# Patient Record
Sex: Female | Born: 1945 | Race: White | Hispanic: No | Marital: Married | Smoking: Never smoker
Health system: Southern US, Community
[De-identification: ages and names within clinical notes are randomized; demographics above are authoritative.]

## PROBLEM LIST (undated history)

## (undated) DIAGNOSIS — E7800 Pure hypercholesterolemia, unspecified: Secondary | ICD-10-CM

## (undated) DIAGNOSIS — D649 Anemia, unspecified: Secondary | ICD-10-CM

## (undated) DIAGNOSIS — M169 Osteoarthritis of hip, unspecified: Secondary | ICD-10-CM

## (undated) DIAGNOSIS — I10 Essential (primary) hypertension: Secondary | ICD-10-CM

## (undated) DIAGNOSIS — C801 Malignant (primary) neoplasm, unspecified: Secondary | ICD-10-CM

## (undated) DIAGNOSIS — R7303 Prediabetes: Secondary | ICD-10-CM

## (undated) HISTORY — PX: CLOSED MANIPULATION SHOULDER: SUR205

## (undated) HISTORY — PX: CHOLECYSTECTOMY: SHX55

## (undated) HISTORY — PX: WISDOM TOOTH EXTRACTION: SHX21

## (undated) HISTORY — PX: TUBAL LIGATION: SHX77

## (undated) HISTORY — PX: BLEPHAROPLASTY: SUR158

---

## 2008-07-02 HISTORY — PX: ABDOMINAL HYSTERECTOMY: SHX81

## 2017-08-21 ENCOUNTER — Ambulatory Visit (INDEPENDENT_AMBULATORY_CARE_PROVIDER_SITE_OTHER): Payer: Medicare HMO

## 2017-08-21 ENCOUNTER — Other Ambulatory Visit (INDEPENDENT_AMBULATORY_CARE_PROVIDER_SITE_OTHER): Payer: Self-pay

## 2017-08-21 ENCOUNTER — Encounter (INDEPENDENT_AMBULATORY_CARE_PROVIDER_SITE_OTHER): Payer: Self-pay | Admitting: Orthopaedic Surgery

## 2017-08-21 ENCOUNTER — Ambulatory Visit (INDEPENDENT_AMBULATORY_CARE_PROVIDER_SITE_OTHER): Payer: Medicare HMO | Admitting: Orthopaedic Surgery

## 2017-08-21 DIAGNOSIS — M25551 Pain in right hip: Secondary | ICD-10-CM

## 2017-08-21 DIAGNOSIS — M1712 Unilateral primary osteoarthritis, left knee: Secondary | ICD-10-CM | POA: Diagnosis not present

## 2017-08-21 DIAGNOSIS — M1611 Unilateral primary osteoarthritis, right hip: Secondary | ICD-10-CM

## 2017-08-21 NOTE — Progress Notes (Signed)
Office Visit Note   Patient: Kelly Ayers           Date of Birth: [birth year]           MRN: [id number] Visit Date: 08/21/2017              Requested by: No referring provider defined for this encounter. PCP: Loraine Leriche., MD   Assessment & Plan: Visit Diagnoses:  1. Primary osteoarthritis of right hip   2. Primary osteoarthritis of left knee     Plan: We will send her for an intra-articular injection of the right hip with Dr. Ernestina Patches.  See him back in approximately 2 months at that time see what type of results she is had with the hip injection and possibly set her up for a right total hip arthroplasty sometime this summer when she is finished with [employer] and [place].  Regards to the knee will see how well she does with the cortisone injection.  Discussed with her at length today both hip and knee replacement.  Due to the fact that she is having severe pain that the quality of life particularly with the right hip she most likely will require a right total hip arthroplasty in the near future.  We are trying conservative measures at this time to get her through [employer] and she is employed in [employer].  Dr. Ninfa Linden and myself discussed with her at length the risks and benefits of hip replacement including infection, DVT/PE, wound healing problems, prolonged pain worsening pain, and nerve /  vessel injury.  Follow-Up Instructions: Return in about 2 months (around 10/19/2017).   Orders:  Orders Placed This Encounter  Procedures  . XR HIP UNILAT W OR W/O PELVIS 1V RIGHT  . XR Knee 1-2 Views Left   No orders of the defined types were placed in this encounter.     Procedures: No procedures performed   Clinical Data: No additional findings.   Subjective: Chief Complaint  Patient presents with  . Right Hip - Pain    HPI Kelly Ayers is a 72 year old female who comes in today with right hip pain for the past 2 years also some low  back pain.  No radicular symptoms down the legs.  She has left knee pain also this been ongoing for some time.  She states she is been told in the past that she needs replacement of both of the right hip and left knee.  Right hip pain is worse than left knee pain.  She has the right hip groin pain.  She has problems donning her shoes and socks trimming her toenails.  Left knee gives way occasionally.  No other mechanical symptoms.  She notes no swelling.  She has trouble going up and down steps due to the hip and knee.  She states overall that her gait is not as good as it used to be and it keeps her from doing activities she otherwise like to dissipate and.  She is tried no braces no assistive devices.  He has had no injections.  Occasional Aleve.  No history of DVT PE Review of Systems No fevers chills shortness of breath chest pain please otherwise see HPI.  Objective: Vital Signs: There were no vitals taken for this visit.  Physical Exam  Constitutional: She is oriented to person, place, and time. She appears well-developed and well-nourished. No distress.  Cardiovascular: Intact distal pulses.  Pulmonary/Chest: Effort normal.  Neurological: She is alert  and oriented to person, place, and time.  Skin: She is not diaphoretic.  Psychiatric: She has a normal mood and affect. Her behavior is normal.    Ortho Exam Bilateral hips left hip good range of motion without pain.  She has discomfort and decreased internal rotation of the right hip.  Calves are supple nontender bilaterally.  Right knee full range of motion without pain.  No tenderness along medial lateral joint line.  No instability valgus varus stressing.  No effusion abnormal warmth erythema of either knee.  Left knee tenderness along the medial joint line.  No instability valgus varus stressing. Specialty Comments:  No specialty comments available.  Imaging: Xr Hip Unilat W Or W/o Pelvis 1v Right  Result Date: 08/21/2017 AP pelvis  and lateral view of the right hip: End-stage arthritis right hip.  Left hip is well maintained.  Both hips well located.  No acute fracture is noted.  Degenerative changes lower lumbar spine noted.  Xr Knee 1-2 Views Left  Result Date: 08/21/2017 Left knee AP lateral views: Near bone-on-bone medial compartment.  Severe patellofemoral arthritic changes.  Lateral compartment overall well-maintained.  No acute fractures.  Knee is well located.  Right knee on the AP view shows blunting of the femoral condyles with moderate compartmental narrowing medial and lateral joint line.    PMFS History: There are no active problems to display for this patient.  History reviewed. No pertinent past medical history.  History reviewed. No pertinent family history.  History reviewed. No pertinent surgical history. Social History   Occupational History  . Not on file  Tobacco Use  . Smoking status: Not on file  Substance and Sexual Activity  . Alcohol use: Not on file  . Drug use: Not on file  . Sexual activity: Not on file

## 2017-08-30 ENCOUNTER — Ambulatory Visit (INDEPENDENT_AMBULATORY_CARE_PROVIDER_SITE_OTHER): Payer: Medicare HMO

## 2017-08-30 ENCOUNTER — Ambulatory Visit (INDEPENDENT_AMBULATORY_CARE_PROVIDER_SITE_OTHER): Payer: Medicare HMO | Admitting: Physical Medicine and Rehabilitation

## 2017-08-30 ENCOUNTER — Encounter (INDEPENDENT_AMBULATORY_CARE_PROVIDER_SITE_OTHER): Payer: Self-pay | Admitting: Physical Medicine and Rehabilitation

## 2017-08-30 DIAGNOSIS — M25551 Pain in right hip: Secondary | ICD-10-CM | POA: Diagnosis not present

## 2017-08-30 MED ORDER — BUPIVACAINE HCL 0.5 % IJ SOLN
3.0000 mL | INTRAMUSCULAR | Status: AC | PRN
  Administered 2017-08-30: 3 mL via INTRA_ARTICULAR

## 2017-08-30 MED ORDER — TRIAMCINOLONE ACETONIDE 40 MG/ML IJ SUSP
80.0000 mg | INTRAMUSCULAR | Status: AC | PRN
  Administered 2017-08-30: 80 mg via INTRA_ARTICULAR

## 2017-08-30 NOTE — Patient Instructions (Signed)

## 2017-08-30 NOTE — Progress Notes (Deleted)
Pt states pain in right hip that radiates through the legs. Pt states pain has been going on for a while. Pt states just walking on her right leg makes pain worse, she has not tried anything to make it better. -Dye Allergies.

## 2017-08-30 NOTE — Progress Notes (Signed)
   Kelly Ayers - 72 y.o. female MRN [id number]  Date of birth: 25-Apr-1946  Office Visit Note: Visit Date: 08/30/2017 PCP: Kelly Ayers., MD Referred by: Kelly Ayers.,*  Subjective: Chief Complaint  Patient presents with  . Right Leg - Pain  . Right Hip - Pain   HPI: Kelly Ayers is a 72 year old female with pretty severe arthritis of the right hip with pain that radiates down through the right hip and leg.  She reports this been going on chronically.  She had a left knee injection by Kelly Ayers recently that did seem to help her knee but she feels like her hip is been worse.  At his request we are going to complete a diagnostic therapeutic anesthetic arthrogram.    ROS Otherwise per HPI.  Assessment & Plan: Visit Diagnoses:  1. Pain in right hip     Plan: Findings:  Diagnostic and therapeutic anesthetic hip arthrogram on the right.  Patient did have 9 mL of serous yellow appearing joint fluid aspirated.  She had significant relief during the anesthetic portion of the injection.    Meds & Orders: No orders of the defined types were placed in this encounter.   Orders Placed This Encounter  Procedures  . Large Joint Inj: R hip joint  . XR C-ARM NO REPORT    Follow-up: Return for Kelly Ayers as scheduled.   Procedures: Large Joint Inj: R hip joint on 08/30/2017 10:13 AM Indications: pain and diagnostic evaluation Details: 22 G needle, anterior approach  Arthrogram: Yes  Medications: 3 mL bupivacaine 0.5 %; 80 mg triamcinolone acetonide 40 MG/ML Aspirate: 9 mL serous and yellow Outcome: tolerated well, no immediate complications  Arthrogram demonstrated excellent flow of contrast throughout the joint surface without extravasation or obvious defect.  The patient had relief of symptoms during the anesthetic phase of the injection.  Procedure, treatment alternatives, risks and benefits explained, specific risks discussed. Consent was given by the patient.  Immediately prior to procedure a time out was called to verify the correct patient, procedure, equipment, support staff and site/side marked as required. Patient was prepped and draped in the usual sterile fashion.      No notes on file   Clinical History: No specialty comments available.  She reports that  has never smoked. she has never used smokeless tobacco. No results for input(s): HGBA1C, LABURIC in the last 8760 hours.  Objective:  VS:  HT:    WT:   BMI:     BP:   HR: bpm  TEMP: ( )  RESP:  Physical Exam  Musculoskeletal:  Decent range of motion of the right hip but she did have pain at end ranges of rotation internally    Ortho Exam Imaging: No results found.  Past Medical/Family/Surgical/Social History: Medications & Allergies reviewed per EMR There are no active problems to display for this patient.  History reviewed. No pertinent past medical history. History reviewed. No pertinent family history. History reviewed. No pertinent surgical history. Social History   Occupational History  . Not on file  Tobacco Use  . Smoking status: Never Smoker  . Smokeless tobacco: Never Used  Substance and Sexual Activity  . Alcohol use: Not on file  . Drug use: Not on file  . Sexual activity: Not on file

## 2017-09-05 ENCOUNTER — Ambulatory Visit (INDEPENDENT_AMBULATORY_CARE_PROVIDER_SITE_OTHER): Payer: Self-pay | Admitting: Physical Medicine and Rehabilitation

## 2017-10-21 ENCOUNTER — Ambulatory Visit (INDEPENDENT_AMBULATORY_CARE_PROVIDER_SITE_OTHER): Payer: Medicare HMO | Admitting: Orthopaedic Surgery

## 2017-10-21 ENCOUNTER — Encounter (INDEPENDENT_AMBULATORY_CARE_PROVIDER_SITE_OTHER): Payer: Self-pay | Admitting: Orthopaedic Surgery

## 2017-10-21 DIAGNOSIS — M1612 Unilateral primary osteoarthritis, left hip: Secondary | ICD-10-CM | POA: Diagnosis not present

## 2017-10-21 NOTE — Progress Notes (Signed)
The patient is well-known to our practice.  She has severe end-stage arthritis involving the right hip.  This is been well documented and shown x-rays.  We went over x-rays again with her in detail.  Her pain is daily and it can be 10 out of 10 at times.  It does wake her up at night.  At this point it is detrimentally affecting her active daily living, her quality of life, mobility.  We did send her for an intra-articular steroid injection her right hip and it did help temporize things shortly but that is already worn off.  This was in March of this year.  He still has a deep pain in her groin.  She does take Aleve.  At this point she is tried and failed all forms of conservative treatment and is interested in hip replacement surgery.  On exam she has severe pain with attempts of internal and external rotation of her right hip and there is stiffness to that right hip as well.  The x-rays show severe end-stage arthritis of the right hip with complete loss of superior lateral joint space.  There is particular osteophytes and cystic changes in the femoral head and acetabulum as well as sclerotic changes.  We spent some time showing her hip model explaining in detail what the surgery involves.  We had a thorough long discussion about the risk and benefits of surgery as well as with her intraoperative and postoperative course were involved.  All questions concerns were answered and addressed.  We will work on getting this scheduled soon.

## 2017-11-15 ENCOUNTER — Other Ambulatory Visit (INDEPENDENT_AMBULATORY_CARE_PROVIDER_SITE_OTHER): Payer: Self-pay

## 2017-11-18 ENCOUNTER — Other Ambulatory Visit (INDEPENDENT_AMBULATORY_CARE_PROVIDER_SITE_OTHER): Payer: Self-pay | Admitting: Physician Assistant

## 2017-11-18 NOTE — Pre-Procedure Instructions (Addendum)
Kelly Ayers  11/18/2017      [address] [place], [place] [address] Phone: [phone] Fax: [phone]    Your procedure is scheduled on 11/26/2017.  Report to [facility] Admitting at 1030 A.M.  Call this number if you have problems the morning of surgery:  [phone]   Remember:  Nothing to eat or drink after midnight on 11/25/2017, the night before your surgery.   Continue all medications as directed by your physician except follow these medication instructions before surgery below    Take these medicines the morning of surgery with A SIP OF WATER: NONE  7 days prior to surgery STOP taking any Aspirin (unless otherwise instructed by your surgeon), Aleve, Naproxen, Ibuprofen, Motrin, Advil, Goody's, BC's, all herbal medications, fish oil, and all vitamins     Do not wear jewelry, make-up or nail polish.  Do not wear lotions, powders, or perfumes, or deodorant.  Do not shave 48 hours prior to surgery.    Do not bring valuables to the hospital.  [facility] is not responsible for any belongings or valuables.  Hearing aids, eyeglasses, contacts, dentures or bridgework may not be worn into surgery.  Leave your suitcase in the car.  After surgery it may be brought to your room.  For patients admitted to the hospital, discharge time will be determined by your treatment team.  Patients discharged the day of surgery will not be allowed to drive home.   Name and phone number of your driver:    Special instructions:   [facility]- Preparing For Surgery  Before surgery, you can play an important role. Because skin is not sterile, your skin needs to be as free of germs as possible. You can reduce the number of germs on your skin by washing with CHG (chlorahexidine gluconate) Soap before surgery.  CHG is an antiseptic cleaner which kills germs and bonds with the skin to continue killing germs  even after washing.    Oral Hygiene is also important to reduce your risk of infection.  Remember - BRUSH YOUR TEETH THE MORNING OF SURGERY WITH YOUR TOOTHPASTE  Please do not use if you have an allergy to CHG or antibacterial soaps. If your skin becomes reddened/irritated stop using the CHG.  Do not shave (including legs and underarms) for at least 48 hours prior to first CHG shower. It is OK to shave your face.  Please follow these instructions carefully.   1. Shower the NIGHT BEFORE SURGERY and the MORNING OF SURGERY with CHG.   2. If you chose to wash your hair, wash your hair first as usual with your normal shampoo.  3. After you shampoo, rinse your hair and body thoroughly to remove the shampoo.  4. Use CHG as you would any other liquid soap. You can apply CHG directly to the skin and wash gently with a scrungie or a clean washcloth.   5. Apply the CHG Soap to your body ONLY FROM THE NECK DOWN.  Do not use on open wounds or open sores. Avoid contact with your eyes, ears, mouth and genitals (private parts). Wash Face and genitals (private parts)  with your normal soap.  6. Wash thoroughly, paying special attention to the area where your surgery will be performed.  7. Thoroughly rinse your body with warm water from the neck down.  8. DO NOT shower/wash with your normal soap after using  and rinsing off the CHG Soap.  9. Pat yourself dry with a CLEAN TOWEL.  10. Wear CLEAN PAJAMAS to bed the night before surgery, wear comfortable clothes the morning of surgery  11. Place CLEAN SHEETS on your bed the night of your first shower and DO NOT SLEEP WITH PETS.    Day of Surgery: Shower as stated above. Do not apply any deodorants/lotions.  Please wear clean clothes to the hospital/surgery center.   Remember to brush your teeth WITH YOUR TOOTHPASTE    Please read over the following fact sheets that you were given.

## 2017-11-19 ENCOUNTER — Encounter (HOSPITAL_COMMUNITY)
Admission: RE | Admit: 2017-11-19 | Discharge: 2017-11-19 | Disposition: A | Discharge status: Home / Self Care | Payer: Medicare HMO | Source: Ambulatory Visit | Attending: Orthopaedic Surgery | Admitting: Orthopaedic Surgery

## 2017-11-19 ENCOUNTER — Other Ambulatory Visit: Payer: Self-pay

## 2017-11-19 ENCOUNTER — Encounter (HOSPITAL_COMMUNITY): Payer: Self-pay

## 2017-11-19 DIAGNOSIS — Z01818 Encounter for other preprocedural examination: Secondary | ICD-10-CM | POA: Insufficient documentation

## 2017-11-19 DIAGNOSIS — I4510 Unspecified right bundle-branch block: Secondary | ICD-10-CM | POA: Diagnosis not present

## 2017-11-19 DIAGNOSIS — I10 Essential (primary) hypertension: Secondary | ICD-10-CM | POA: Insufficient documentation

## 2017-11-19 DIAGNOSIS — Z01812 Encounter for preprocedural laboratory examination: Secondary | ICD-10-CM | POA: Insufficient documentation

## 2017-11-19 HISTORY — DX: Essential (primary) hypertension: I10

## 2017-11-19 HISTORY — DX: Prediabetes: R73.03

## 2017-11-19 HISTORY — DX: Pure hypercholesterolemia, unspecified: E78.00

## 2017-11-19 HISTORY — DX: Anemia, unspecified: D64.9

## 2017-11-19 HISTORY — DX: Osteoarthritis of hip, unspecified: M16.9

## 2017-11-19 HISTORY — DX: Malignant (primary) neoplasm, unspecified: C80.1

## 2017-11-19 LAB — CBC
HCT: 44.2 % (ref 36.0–46.0)
Hemoglobin: 14.4 g/dL (ref 12.0–15.0)
MCH: 29.7 pg (ref 26.0–34.0)
MCHC: 32.6 g/dL (ref 30.0–36.0)
MCV: 91.1 fL (ref 78.0–100.0)
PLATELETS: 192 10*3/uL (ref 150–400)
RBC: 4.85 MIL/uL (ref 3.87–5.11)
RDW: 12.3 % (ref 11.5–15.5)
WBC: 6.1 10*3/uL (ref 4.0–10.5)

## 2017-11-19 LAB — BASIC METABOLIC PANEL
Anion gap: 8 (ref 5–15)
BUN: 8 mg/dL (ref 6–20)
CHLORIDE: 106 mmol/L (ref 101–111)
CO2: 28 mmol/L (ref 22–32)
Calcium: 9.4 mg/dL (ref 8.9–10.3)
Creatinine, Ser: 0.66 mg/dL (ref 0.44–1.00)
GFR calc Af Amer: >60 mL/min (ref >60)
GFR calc non Af Amer: >60 mL/min (ref >60)
GLUCOSE: 120 mg/dL — AB (ref 65–99)
Potassium: 3.9 mmol/L (ref 3.5–5.1)
Sodium: 142 mmol/L (ref 135–145)

## 2017-11-19 LAB — SURGICAL PCR SCREEN
MRSA, PCR: NEGATIVE
Staphylococcus aureus: NEGATIVE

## 2017-11-19 LAB — GLUCOSE, CAPILLARY: GLUCOSE-CAPILLARY: 105 mg/dL — AB (ref 65–99)

## 2017-11-19 NOTE — Progress Notes (Signed)
PCP - Dr. Idamae Schuller Cardiologist - patient denies  Chest x-ray - n/a EKG - 11/19/2017 Stress Test - 15+ years ago ECHO - patient denies Cardiac Cath - patient denies  Sleep Study - patient denies CPAP -   Fasting Blood Sugar - patient unsure Checks Blood Sugar 0 times a day  Blood Thinner Instructions: n/a Aspirin Instructions:n/a  Anesthesia review: n/a  Patient denies shortness of breath, fever, cough and chest pain at PAT appointment   Patient verbalized understanding of instructions that were given to them at the PAT appointment. Patient was also instructed that they will need to review over the PAT instructions again at home before surgery.

## 2017-11-22 MED ORDER — SODIUM CHLORIDE 0.9 % IV SOLN
1000.0000 mg | INTRAVENOUS | Status: DC
  Filled 2017-11-22: qty 10

## 2017-11-25 NOTE — Anesthesia Preprocedure Evaluation (Addendum)
Anesthesia Evaluation  Patient identified by MRN, date of birth, ID band Patient awake    Reviewed: Allergy & Precautions, NPO status , Patient's Chart, lab work & pertinent test results  Airway Mallampati: I  TM Distance: >3 FB Neck ROM: Full    Dental no notable dental hx. (+) Teeth Intact, Dental Advisory Given   Pulmonary neg pulmonary ROS,    Pulmonary exam normal breath sounds clear to auscultation       Cardiovascular Exercise Tolerance: Good hypertension, Pt. on medications Normal cardiovascular exam Rhythm:Regular Rate:Normal     Neuro/Psych negative neurological ROS     GI/Hepatic negative GI ROS,   Endo/Other    Renal/GU      Musculoskeletal negative musculoskeletal ROS (+) Arthritis ,   Abdominal   Peds  Hematology negative hematology ROS (+)   Anesthesia Other Findings   Reproductive/Obstetrics                            Lab Results  Component Value Date   WBC 6.1 11/19/2017   HGB 14.4 11/19/2017   HCT 44.2 11/19/2017   MCV 91.1 11/19/2017   PLT 192 11/19/2017    Anesthesia Physical Anesthesia Plan  ASA: II  Anesthesia Plan: Spinal   Post-op Pain Management:    Induction:   PONV Risk Score and Plan: Treatment may vary due to age or medical condition  Airway Management Planned: Mask, Natural Airway and Nasal Cannula  Additional Equipment:   Intra-op Plan:   Post-operative Plan:   Informed Consent: I have reviewed the patients History and Physical, chart, labs and discussed the procedure including the risks, benefits and alternatives for the proposed anesthesia with the patient or authorized representative who has indicated his/her understanding and acceptance.     Plan Discussed with: CRNA  Anesthesia Plan Comments:        Anesthesia Quick Evaluation

## 2017-11-26 ENCOUNTER — Inpatient Hospital Stay (HOSPITAL_COMMUNITY): Payer: Medicare HMO

## 2017-11-26 ENCOUNTER — Inpatient Hospital Stay (HOSPITAL_COMMUNITY)
Admission: RE | Admit: 2017-11-26 | Discharge: 2017-11-29 | DRG: 470 | Disposition: A | Discharge status: Home Health | Payer: Medicare HMO | Source: Ambulatory Visit | Attending: Orthopaedic Surgery | Admitting: Orthopaedic Surgery

## 2017-11-26 ENCOUNTER — Encounter (HOSPITAL_COMMUNITY)
Admission: RE | Disposition: A | Discharge status: Home Health | Payer: Self-pay | Source: Ambulatory Visit | Attending: Orthopaedic Surgery

## 2017-11-26 ENCOUNTER — Inpatient Hospital Stay (HOSPITAL_COMMUNITY): Payer: Medicare HMO | Admitting: Anesthesiology

## 2017-11-26 ENCOUNTER — Encounter (HOSPITAL_COMMUNITY): Payer: Self-pay | Admitting: Certified Registered Nurse Anesthetist

## 2017-11-26 DIAGNOSIS — D62 Acute posthemorrhagic anemia: Secondary | ICD-10-CM | POA: Diagnosis not present

## 2017-11-26 DIAGNOSIS — M1611 Unilateral primary osteoarthritis, right hip: Secondary | ICD-10-CM

## 2017-11-26 DIAGNOSIS — Z7982 Long term (current) use of aspirin: Secondary | ICD-10-CM

## 2017-11-26 DIAGNOSIS — E7800 Pure hypercholesterolemia, unspecified: Secondary | ICD-10-CM | POA: Diagnosis present

## 2017-11-26 DIAGNOSIS — Z79899 Other long term (current) drug therapy: Secondary | ICD-10-CM | POA: Diagnosis not present

## 2017-11-26 DIAGNOSIS — Z419 Encounter for procedure for purposes other than remedying health state, unspecified: Secondary | ICD-10-CM

## 2017-11-26 DIAGNOSIS — R7303 Prediabetes: Secondary | ICD-10-CM | POA: Diagnosis present

## 2017-11-26 DIAGNOSIS — I10 Essential (primary) hypertension: Secondary | ICD-10-CM | POA: Diagnosis present

## 2017-11-26 DIAGNOSIS — Z96641 Presence of right artificial hip joint: Secondary | ICD-10-CM

## 2017-11-26 HISTORY — PX: TOTAL HIP ARTHROPLASTY: SHX124

## 2017-11-26 SURGERY — ARTHROPLASTY, HIP, TOTAL, ANTERIOR APPROACH
Anesthesia: Spinal | Laterality: Right

## 2017-11-26 MED ORDER — ONDANSETRON HCL 4 MG/2ML IJ SOLN: 4.0000 mg | Freq: Four times a day (QID) | INTRAMUSCULAR | Status: DC | PRN

## 2017-11-26 MED ORDER — CEFAZOLIN SODIUM-DEXTROSE 2-4 GM/100ML-% IV SOLN
2.0000 g | INTRAVENOUS | Status: AC
  Administered 2017-11-26: 2 g via INTRAVENOUS
  Filled 2017-11-26: qty 100

## 2017-11-26 MED ORDER — TRANEXAMIC ACID 1000 MG/10ML IV SOLN
2000.0000 mg | INTRAVENOUS | Status: AC
  Administered 2017-11-26: 2000 mg via TOPICAL
  Filled 2017-11-26: qty 20

## 2017-11-26 MED ORDER — EPHEDRINE SULFATE-NACL 50-0.9 MG/10ML-% IV SOSY
PREFILLED_SYRINGE | INTRAVENOUS | Status: DC | PRN
  Administered 2017-11-26: 5 mg via INTRAVENOUS

## 2017-11-26 MED ORDER — POLYETHYLENE GLYCOL 3350 17 G PO PACK: 17.0000 g | PACK | Freq: Every day | ORAL | Status: DC | PRN

## 2017-11-26 MED ORDER — BUPIVACAINE IN DEXTROSE 0.75-8.25 % IT SOLN
INTRATHECAL | Status: DC | PRN
  Administered 2017-11-26: 2 mL via INTRATHECAL

## 2017-11-26 MED ORDER — FENTANYL CITRATE (PF) 100 MCG/2ML IJ SOLN
INTRAMUSCULAR | Status: DC | PRN
  Administered 2017-11-26: 50 ug via INTRAVENOUS

## 2017-11-26 MED ORDER — HYDROMORPHONE HCL 2 MG/ML IJ SOLN: 0.3000 mg | INTRAMUSCULAR | Status: DC | PRN

## 2017-11-26 MED ORDER — ACETAMINOPHEN 325 MG PO TABS: 325.0000 mg | ORAL_TABLET | Freq: Four times a day (QID) | ORAL | Status: DC | PRN

## 2017-11-26 MED ORDER — OXYCODONE HCL 5 MG PO TABS
5.0000 mg | ORAL_TABLET | ORAL | Status: DC | PRN
  Administered 2017-11-26 – 2017-11-29 (×8): 10 mg via ORAL
  Filled 2017-11-26 (×8): qty 2

## 2017-11-26 MED ORDER — PROPOFOL 500 MG/50ML IV EMUL
INTRAVENOUS | Status: DC | PRN
  Administered 2017-11-26: 75 ug/kg/min via INTRAVENOUS

## 2017-11-26 MED ORDER — 0.9 % SODIUM CHLORIDE (POUR BTL) OPTIME
TOPICAL | Status: DC | PRN
  Administered 2017-11-26: 1000 mL

## 2017-11-26 MED ORDER — ALUM & MAG HYDROXIDE-SIMETH 200-200-20 MG/5ML PO SUSP: 30.0000 mL | ORAL | Status: DC | PRN

## 2017-11-26 MED ORDER — DEXAMETHASONE SODIUM PHOSPHATE 10 MG/ML IJ SOLN
INTRAMUSCULAR | Status: AC
  Filled 2017-11-26: qty 1

## 2017-11-26 MED ORDER — METHOCARBAMOL 1000 MG/10ML IJ SOLN
500.0000 mg | Freq: Four times a day (QID) | INTRAVENOUS | Status: DC | PRN
  Filled 2017-11-26: qty 5

## 2017-11-26 MED ORDER — LIDOCAINE 2% (20 MG/ML) 5 ML SYRINGE
INTRAMUSCULAR | Status: AC
  Filled 2017-11-26: qty 5

## 2017-11-26 MED ORDER — MEPERIDINE HCL 50 MG/ML IJ SOLN
6.2500 mg | INTRAMUSCULAR | Status: DC | PRN
Start: 2017-11-26 — End: 2017-11-26

## 2017-11-26 MED ORDER — ACETAMINOPHEN 10 MG/ML IV SOLN: 1000.0000 mg | Freq: Once | INTRAVENOUS | Status: DC | PRN

## 2017-11-26 MED ORDER — ZOLPIDEM TARTRATE 5 MG PO TABS
5.0000 mg | ORAL_TABLET | Freq: Every evening | ORAL | Status: DC | PRN
  Administered 2017-11-26: 5 mg via ORAL
  Filled 2017-11-26: qty 1

## 2017-11-26 MED ORDER — PHENYLEPHRINE HCL 10 MG/ML IJ SOLN
INTRAMUSCULAR | Status: DC | PRN
  Administered 2017-11-26: 25 ug/min via INTRAVENOUS

## 2017-11-26 MED ORDER — DIPHENHYDRAMINE HCL 12.5 MG/5ML PO ELIX: 12.5000 mg | ORAL_SOLUTION | ORAL | Status: DC | PRN

## 2017-11-26 MED ORDER — ROCURONIUM BROMIDE 10 MG/ML (PF) SYRINGE
PREFILLED_SYRINGE | INTRAVENOUS | Status: AC
  Filled 2017-11-26: qty 5

## 2017-11-26 MED ORDER — OXYCODONE HCL 5 MG PO TABS: 10.0000 mg | ORAL_TABLET | ORAL | Status: DC | PRN

## 2017-11-26 MED ORDER — PHENYLEPHRINE 40 MCG/ML (10ML) SYRINGE FOR IV PUSH (FOR BLOOD PRESSURE SUPPORT)
PREFILLED_SYRINGE | INTRAVENOUS | Status: DC | PRN
  Administered 2017-11-26 (×2): 80 ug via INTRAVENOUS

## 2017-11-26 MED ORDER — LIDOCAINE 2% (20 MG/ML) 5 ML SYRINGE
INTRAMUSCULAR | Status: DC | PRN
  Administered 2017-11-26: 40 mg via INTRAVENOUS

## 2017-11-26 MED ORDER — SODIUM CHLORIDE 0.9 % IV SOLN
INTRAVENOUS | Status: DC
  Administered 2017-11-26 – 2017-11-27 (×2): via INTRAVENOUS

## 2017-11-26 MED ORDER — ROSUVASTATIN CALCIUM 10 MG PO TABS
10.0000 mg | ORAL_TABLET | Freq: Every day | ORAL | Status: DC
  Administered 2017-11-27 – 2017-11-28 (×2): 10 mg via ORAL
  Filled 2017-11-26 (×2): qty 1

## 2017-11-26 MED ORDER — MENTHOL 3 MG MT LOZG: 1.0000 | LOZENGE | OROMUCOSAL | Status: DC | PRN

## 2017-11-26 MED ORDER — DOCUSATE SODIUM 100 MG PO CAPS
100.0000 mg | ORAL_CAPSULE | Freq: Two times a day (BID) | ORAL | Status: DC
  Administered 2017-11-27 – 2017-11-29 (×5): 100 mg via ORAL
  Filled 2017-11-26 (×5): qty 1

## 2017-11-26 MED ORDER — DEXAMETHASONE SODIUM PHOSPHATE 10 MG/ML IJ SOLN
INTRAMUSCULAR | Status: DC | PRN
  Administered 2017-11-26: 5 mg via INTRAVENOUS

## 2017-11-26 MED ORDER — CEFAZOLIN SODIUM-DEXTROSE 1-4 GM/50ML-% IV SOLN
1.0000 g | Freq: Four times a day (QID) | INTRAVENOUS | Status: AC
  Administered 2017-11-26: 1 g via INTRAVENOUS
  Filled 2017-11-26 (×3): qty 50

## 2017-11-26 MED ORDER — ONDANSETRON HCL 4 MG PO TABS
4.0000 mg | ORAL_TABLET | Freq: Four times a day (QID) | ORAL | Status: DC | PRN
Start: 2017-11-26 — End: 2017-11-29

## 2017-11-26 MED ORDER — PANTOPRAZOLE SODIUM 40 MG PO TBEC
40.0000 mg | DELAYED_RELEASE_TABLET | Freq: Every day | ORAL | Status: DC
  Administered 2017-11-27 – 2017-11-29 (×3): 40 mg via ORAL
  Filled 2017-11-26 (×3): qty 1

## 2017-11-26 MED ORDER — LACTATED RINGERS IV SOLN
INTRAVENOUS | Status: DC
  Administered 2017-11-26 (×2): via INTRAVENOUS

## 2017-11-26 MED ORDER — FENTANYL CITRATE (PF) 250 MCG/5ML IJ SOLN
INTRAMUSCULAR | Status: AC
  Filled 2017-11-26: qty 5

## 2017-11-26 MED ORDER — HYDROMORPHONE HCL 2 MG/ML IJ SOLN: 0.5000 mg | INTRAMUSCULAR | Status: DC | PRN

## 2017-11-26 MED ORDER — CHLORHEXIDINE GLUCONATE 4 % EX LIQD: 60.0000 mL | Freq: Once | CUTANEOUS | Status: DC

## 2017-11-26 MED ORDER — MIDAZOLAM HCL 2 MG/2ML IJ SOLN
INTRAMUSCULAR | Status: AC
  Filled 2017-11-26: qty 2

## 2017-11-26 MED ORDER — METHOCARBAMOL 500 MG PO TABS
500.0000 mg | ORAL_TABLET | Freq: Four times a day (QID) | ORAL | Status: DC | PRN
  Administered 2017-11-26 – 2017-11-27 (×2): 500 mg via ORAL
  Filled 2017-11-26 (×3): qty 1

## 2017-11-26 MED ORDER — METOCLOPRAMIDE HCL 5 MG/ML IJ SOLN: 5.0000 mg | Freq: Three times a day (TID) | INTRAMUSCULAR | Status: DC | PRN

## 2017-11-26 MED ORDER — ONDANSETRON HCL 4 MG/2ML IJ SOLN
INTRAMUSCULAR | Status: AC
  Filled 2017-11-26: qty 2

## 2017-11-26 MED ORDER — ONDANSETRON HCL 4 MG/2ML IJ SOLN
INTRAMUSCULAR | Status: AC
  Filled 2017-11-26: qty 4

## 2017-11-26 MED ORDER — PHENOL 1.4 % MT LIQD
1.0000 | OROMUCOSAL | Status: DC | PRN
Start: 2017-11-26 — End: 2017-11-29

## 2017-11-26 MED ORDER — ONDANSETRON HCL 4 MG/2ML IJ SOLN
INTRAMUSCULAR | Status: DC | PRN
  Administered 2017-11-26: 4 mg via INTRAVENOUS

## 2017-11-26 MED ORDER — METOCLOPRAMIDE HCL 5 MG PO TABS: 5.0000 mg | ORAL_TABLET | Freq: Three times a day (TID) | ORAL | Status: DC | PRN

## 2017-11-26 MED ORDER — ASPIRIN 81 MG PO CHEW
81.0000 mg | CHEWABLE_TABLET | Freq: Two times a day (BID) | ORAL | Status: DC
  Administered 2017-11-26 – 2017-11-29 (×6): 81 mg via ORAL
  Filled 2017-11-26 (×6): qty 1

## 2017-11-26 MED ORDER — HYDROCODONE-ACETAMINOPHEN 7.5-325 MG PO TABS: 1.0000 | ORAL_TABLET | Freq: Once | ORAL | Status: DC | PRN

## 2017-11-26 MED ORDER — PROMETHAZINE HCL 25 MG/ML IJ SOLN: 6.2500 mg | INTRAMUSCULAR | Status: DC | PRN

## 2017-11-26 MED ORDER — DEXAMETHASONE SODIUM PHOSPHATE 10 MG/ML IJ SOLN
INTRAMUSCULAR | Status: AC
  Filled 2017-11-26: qty 2

## 2017-11-26 MED ORDER — MIDAZOLAM HCL 5 MG/5ML IJ SOLN
INTRAMUSCULAR | Status: DC | PRN
  Administered 2017-11-26: 1 mg via INTRAVENOUS

## 2017-11-26 SURGICAL SUPPLY — 53 items
BENZOIN TINCTURE PRP APPL 2/3 (GAUZE/BANDAGES/DRESSINGS) ×3 IMPLANT
BLADE CLIPPER SURG (BLADE) IMPLANT
BLADE SAW SGTL 18X1.27X75 (BLADE) ×2 IMPLANT
BLADE SAW SGTL 18X1.27X75MM (BLADE) ×1
CAPT HIP TOTAL 2 ×3 IMPLANT
CELLS DAT CNTRL 66122 CELL SVR (MISCELLANEOUS) ×1 IMPLANT
CLOSURE STERI-STRIP 1/2X4 (GAUZE/BANDAGES/DRESSINGS) ×1
CLOSURE WOUND 1/2 X4 (GAUZE/BANDAGES/DRESSINGS) ×2
CLSR STERI-STRIP ANTIMIC 1/2X4 (GAUZE/BANDAGES/DRESSINGS) ×2 IMPLANT
COVER SURGICAL LIGHT HANDLE (MISCELLANEOUS) ×3 IMPLANT
DRAPE C-ARM 42X72 X-RAY (DRAPES) ×3 IMPLANT
DRAPE STERI IOBAN 125X83 (DRAPES) ×3 IMPLANT
DRAPE U-SHAPE 47X51 STRL (DRAPES) ×9 IMPLANT
DRSG AQUACEL AG ADV 3.5X10 (GAUZE/BANDAGES/DRESSINGS) ×3 IMPLANT
DURAPREP 26ML APPLICATOR (WOUND CARE) ×3 IMPLANT
ELECT BLADE 4.0 EZ CLEAN MEGAD (MISCELLANEOUS) ×3
ELECT BLADE 6.5 EXT (BLADE) ×3 IMPLANT
ELECT REM PT RETURN 9FT ADLT (ELECTROSURGICAL) ×3
ELECTRODE BLDE 4.0 EZ CLN MEGD (MISCELLANEOUS) ×1 IMPLANT
ELECTRODE REM PT RTRN 9FT ADLT (ELECTROSURGICAL) ×1 IMPLANT
FACESHIELD WRAPAROUND (MASK) ×6 IMPLANT
GLOVE BIOGEL PI IND STRL 8 (GLOVE) ×2 IMPLANT
GLOVE BIOGEL PI INDICATOR 8 (GLOVE) ×4
GLOVE ECLIPSE 8.0 STRL XLNG CF (GLOVE) ×3 IMPLANT
GLOVE ORTHO TXT STRL SZ7.5 (GLOVE) ×6 IMPLANT
GOWN STRL REUS W/ TWL LRG LVL3 (GOWN DISPOSABLE) ×2 IMPLANT
GOWN STRL REUS W/ TWL XL LVL3 (GOWN DISPOSABLE) ×3 IMPLANT
GOWN STRL REUS W/TWL LRG LVL3 (GOWN DISPOSABLE) ×4
GOWN STRL REUS W/TWL XL LVL3 (GOWN DISPOSABLE) ×6
HANDPIECE INTERPULSE COAX TIP (DISPOSABLE)
KIT BASIN OR (CUSTOM PROCEDURE TRAY) ×3 IMPLANT
KIT TURNOVER KIT B (KITS) ×3 IMPLANT
MANIFOLD NEPTUNE II (INSTRUMENTS) ×3 IMPLANT
NS IRRIG 1000ML POUR BTL (IV SOLUTION) ×3 IMPLANT
PACK TOTAL JOINT (CUSTOM PROCEDURE TRAY) ×3 IMPLANT
PAD ARMBOARD 7.5X6 YLW CONV (MISCELLANEOUS) ×3 IMPLANT
RTRCTR WOUND ALEXIS 18CM MED (MISCELLANEOUS) ×3
SET HNDPC FAN SPRY TIP SCT (DISPOSABLE) IMPLANT
STAPLER VISISTAT 35W (STAPLE) IMPLANT
STRIP CLOSURE SKIN 1/2X4 (GAUZE/BANDAGES/DRESSINGS) ×4 IMPLANT
SUT ETHIBOND NAB CT1 #1 30IN (SUTURE) ×3 IMPLANT
SUT MNCRL AB 4-0 PS2 18 (SUTURE) IMPLANT
SUT VIC AB 0 CT1 27 (SUTURE) ×2
SUT VIC AB 0 CT1 27XBRD ANBCTR (SUTURE) ×1 IMPLANT
SUT VIC AB 1 CT1 27 (SUTURE) ×2
SUT VIC AB 1 CT1 27XBRD ANBCTR (SUTURE) ×1 IMPLANT
SUT VIC AB 2-0 CT1 27 (SUTURE) ×2
SUT VIC AB 2-0 CT1 TAPERPNT 27 (SUTURE) ×1 IMPLANT
TOWEL OR 17X24 6PK STRL BLUE (TOWEL DISPOSABLE) ×3 IMPLANT
TOWEL OR 17X26 10 PK STRL BLUE (TOWEL DISPOSABLE) ×3 IMPLANT
TRAY CATH 16FR W/PLASTIC CATH (SET/KITS/TRAYS/PACK) IMPLANT
TRAY FOLEY MTR SLVR 16FR STAT (SET/KITS/TRAYS/PACK) ×3 IMPLANT
WATER STERILE IRR 1000ML POUR (IV SOLUTION) IMPLANT

## 2017-11-26 NOTE — Anesthesia Postprocedure Evaluation (Signed)
Anesthesia Post Note  Patient: Kelly Ayers  Procedure(s) Performed: RIGHT TOTAL HIP ARTHROPLASTY ANTERIOR APPROACH (Right )     Patient location during evaluation: PACU Anesthesia Type: Spinal Level of consciousness: oriented and awake and alert Pain management: pain level controlled Vital Signs Assessment: post-procedure vital signs reviewed and stable Respiratory status: spontaneous breathing, respiratory function stable and patient connected to nasal cannula oxygen Cardiovascular status: blood pressure returned to baseline and stable Postop Assessment: no headache, no backache and no apparent nausea or vomiting Anesthetic complications: no    Last Vitals:  Vitals:   11/26/17 1740 11/26/17 1803  BP:  135/78  Pulse:  65  Resp:  16  Temp: 36.7 C 36.7 C  SpO2:  96%    Last Pain:  Vitals:   11/26/17 1803  TempSrc: Oral  PainSc:                  Barnet Glasgow

## 2017-11-26 NOTE — Plan of Care (Signed)
  Problem: Education: Goal: Knowledge of General Education information will improve Outcome: Progressing   Problem: Clinical Measurements: Goal: Ability to maintain clinical measurements within normal limits will improve Outcome: Progressing   Problem: Clinical Measurements: Goal: Will remain free from infection Outcome: Progressing   Problem: Activity: Goal: Risk for activity intolerance will decrease Outcome: Progressing   Problem: Pain Managment: Goal: General experience of comfort will improve Outcome: Progressing   

## 2017-11-26 NOTE — Transfer of Care (Signed)
Immediate Anesthesia Transfer of Care Note  Patient: Kelly Ayers  Procedure(s) Performed: RIGHT TOTAL HIP ARTHROPLASTY ANTERIOR APPROACH (Right )  Patient Location: PACU  Anesthesia Type:Spinal  Level of Consciousness: awake, alert  and oriented  Airway & Oxygen Therapy: Patient Spontanous Breathing  Post-op Assessment: Report given to RN and Post -op Vital signs reviewed and stable  Post vital signs: Reviewed and stable  Last Vitals:  Vitals Value Taken Time  BP 100/63 11/26/2017  1:50 PM  Temp    Pulse 60 11/26/2017  1:51 PM  Resp 14 11/26/2017  1:51 PM  SpO2 97 % 11/26/2017  1:51 PM  Vitals shown include unvalidated device data.  Last Pain:  Vitals:   11/26/17 1016  TempSrc:   PainSc: 0-No pain         Complications: No apparent anesthesia complications

## 2017-11-26 NOTE — Anesthesia Procedure Notes (Signed)
Spinal  Patient location during procedure: OR Start time: 11/26/2017 12:06 PM End time: 11/26/2017 12:09 PM Staffing Anesthesiologist: Barnet Glasgow, MD Spinal Block Patient position: sitting Prep: ChloraPrep Patient monitoring: heart rate, cardiac monitor, continuous pulse ox and blood pressure Approach: midline Location: L3-4 Injection technique: single-shot Needle Needle type: Sprotte  Needle gauge: 24 G Needle length: 9 cm Needle insertion depth: 6 cm Assessment Sensory level: T4

## 2017-11-26 NOTE — Anesthesia Procedure Notes (Signed)
Procedure Name: MAC Date/Time: 11/26/2017 12:12 PM Performed by: Candis Shine, CRNA Pre-anesthesia Checklist: Patient identified, Emergency Drugs available, Suction available, Patient being monitored and Timeout performed Patient Re-evaluated:Patient Re-evaluated prior to induction Oxygen Delivery Method: Simple face mask Dental Injury: Teeth and Oropharynx as per pre-operative assessment

## 2017-11-26 NOTE — H&P (Signed)
TOTAL HIP ADMISSION H&P  Patient is admitted for right total hip arthroplasty.  Subjective:  Chief Complaint: right hip pain  HPI: Kelly Ayers, 72 y.o. female, has a history of pain and functional disability in the right hip(s) due to arthritis and patient has failed non-surgical conservative treatments for greater than 12 weeks to include NSAID's and/or analgesics, corticosteriod injections and activity modification.  Onset of symptoms was gradual starting 2 years ago with gradually worsening course since that time.The patient noted no past surgery on the right hip(s).  Patient currently rates pain in the right hip at 9 out of 10 with activity. Patient has night pain, worsening of pain with activity and weight bearing, pain that interfers with activities of daily living and pain with passive range of motion. Patient has evidence of subchondral cysts, subchondral sclerosis, periarticular osteophytes and joint space narrowing by imaging studies. This condition presents safety issues increasing the risk of falls.  There is no current active infection.  Patient Active Problem List   Diagnosis Date Noted  . Osteoarthritis of right hip 11/26/2017   Past Medical History:  Diagnosis Date  . Anemia    after hysterectomy; had to have blood transfusion; no current issues  . Cancer ([place])   . High cholesterol   . Hypertension   . Osteoarthritis of hip   . Pre-diabetes     Past Surgical History:  Procedure Laterality Date  . ABDOMINAL HYSTERECTOMY  2010  . BLEPHAROPLASTY Bilateral   . CHOLECYSTECTOMY    . CLOSED MANIPULATION SHOULDER Right   . TUBAL LIGATION    . VAGINAL DELIVERY     x 3  . WISDOM TOOTH EXTRACTION      Current Facility-Administered Medications  Medication Dose Route Frequency Provider Last Rate Last Dose  . ceFAZolin (ANCEF) IVPB 2g/100 mL premix  2 g Intravenous On Call to OR Pete Pelt, PA-C      . chlorhexidine (HIBICLENS) 4 % liquid 4 application  60 mL Topical  Once Erskine Emery W, PA-C      . lactated ringers infusion   Intravenous Continuous Barnet Glasgow, MD 50 mL/hr at 11/26/17 1034    . tranexamic acid (CYKLOKAPRON) 1,000 mg in sodium chloride 0.9 % 100 mL IVPB  1,000 mg Intravenous To [facility] Mcarthur Rossetti, MD       No Known Allergies  Social History   Tobacco Use  . Smoking status: Never Smoker  . Smokeless tobacco: Never Used  Substance Use Topics  . Alcohol use: Yes    Comment: occasionally    History reviewed. No pertinent family history.   Review of Systems  Musculoskeletal: Positive for back pain and joint pain.  All other systems reviewed and are negative.   Objective:  Physical Exam  Constitutional: She is oriented to person, place, and time. She appears well-developed and well-nourished.  HENT:  Head: Normocephalic and atraumatic.  Eyes: Pupils are equal, round, and reactive to light. EOM are normal.  Neck: Normal range of motion. Neck supple.  Cardiovascular: Normal rate and regular rhythm.  Respiratory: Effort normal and breath sounds normal.  GI: Soft. Bowel sounds are normal.  Musculoskeletal:       Right hip: She exhibits decreased range of motion, decreased strength, tenderness and bony tenderness.  Neurological: She is alert and oriented to person, place, and time.  Skin: Skin is warm and dry.  Psychiatric: She has a normal mood and affect.    Vital signs in last 24 hours:  Temp:  [98.3 F (36.8 C)] 98.3 F (36.8 C) (05/28 0956) Pulse Rate:  [72] 72 (05/28 0956) Resp:  [20] 20 (05/28 0956) BP: (167)/(75) 167/75 (05/28 0956) SpO2:  [96 %] 96 % (05/28 0956)  Labs:   Estimated body mass index is 27.17 kg/m as calculated from the following:   Height as of 11/19/17: 5' 7.5" (1.715 m).   Weight as of 11/19/17: 176 lb 1.6 oz (79.9 kg).   Imaging Review Plain radiographs demonstrate severe degenerative joint disease of the right hip(s). The bone quality appears to be excellent for age and  reported activity level.    Preoperative templating of the joint replacement has been completed, documented, and submitted to the Operating Room personnel in order to optimize intra-operative equipment management.     Assessment/Plan:  End stage arthritis, right hip(s)  The patient history, physical examination, clinical judgement of the provider and imaging studies are consistent with end stage degenerative joint disease of the right hip(s) and total hip arthroplasty is deemed medically necessary. The treatment options including medical management, injection therapy, arthroscopy and arthroplasty were discussed at length. The risks and benefits of total hip arthroplasty were presented and reviewed. The risks due to aseptic loosening, infection, stiffness, dislocation/subluxation,  thromboembolic complications and other imponderables were discussed.  The patient acknowledged the explanation, agreed to proceed with the plan and consent was signed. Patient is being admitted for inpatient treatment for surgery, pain control, PT, OT, prophylactic antibiotics, VTE prophylaxis, progressive ambulation and ADL's and discharge planning.The patient is planning to be discharged home with home health services

## 2017-11-26 NOTE — Progress Notes (Signed)
Orthopedic Tech Progress Note Patient Details:  Kelly Ayers 08-19-1945 [id number]  Patient ID: Kelly Ayers, female   DOB: 17-Sep-1945, 72 y.o.   MRN: [id number] Pt cant have ohf due to age restrictions.  Kelly Ayers 11/26/2017, 10:59 PM

## 2017-11-26 NOTE — Brief Op Note (Signed)
11/26/2017  1:31 PM  PATIENT:  Julius Bowels  72 y.o. female  PRE-OPERATIVE DIAGNOSIS:  osteoarthritis right hip  POST-OPERATIVE DIAGNOSIS:  osteoarthritis right hip  PROCEDURE:  Procedure(s): RIGHT TOTAL HIP ARTHROPLASTY ANTERIOR APPROACH (Right)  SURGEON:  Surgeon(s) and Role:    Mcarthur Rossetti, MD - Primary  PHYSICIAN ASSISTANT: Benita Stabile, PA-C assisted  ANESTHESIA:   spinal  EBL:  350 mL   COUNTS:  YES  DICTATION: .Other Dictation: Dictation Number [id number]  PLAN OF CARE: Admit to inpatient   PATIENT DISPOSITION:  PACU - hemodynamically stable.   Delay start of Pharmacological VTE agent (>24hrs) due to surgical blood loss or risk of bleeding: no

## 2017-11-26 NOTE — Op Note (Signed)
Kelly Ayers, [facility] MEDICAL RECORD [id number] ACCOUNT [id number] DATE OF BIRTH:25-Dec-1945 FACILITY: [facility] LOCATION: [facility] PHYSICIAN:Keynan Heffern [place], MD  OPERATIVE REPORT  DATE OF PROCEDURE:  11/26/2017  PREOPERATIVE DIAGNOSES:  Primary osteoarthritis and degenerative joint disease, right hip.  POSTOPERATIVE DIAGNOSES:  Primary osteoarthritis and degenerative joint disease, right hip.  PROCEDURE:  Right total hip arthroplasty through direct anterior approach.  IMPLANTS:  DePuy Sector Gription acetabular component size 52 with a single screw, size 36+0 polyethylene liner, size 11 Corail femoral component with standard offset, size 36+1.5 ceramic head ball.  SURGEON:  Lind Guest. Ninfa Linden, M.D.  ASSISTANT:   Benita Stabile, PA-C   ANESTHESIA:  Spinal.  ANTIBIOTICS:  Two grams IV Ancef.  ESTIMATED BLOOD LOSS:  300.  COMPLICATIONS:  None.  INDICATIONS:  The patient is a 72 year old female with debilitating arthritis involving her right hip.  It is well documented on x-rays and clinical exam.  She has had intra-articular injections that did help subside her pain some, but it has come back.   Her pain is daily and at this point is detrimentally affecting her activities of daily living, quality of life, mobility.  She understands the risk with surgery Include acute blood loss anemia, nerve and vessel injury, fracture, infection, dislocation,  DVT.  She understands her goals are to decrease pain and improve mobility and overall improve quality of life.  Note:  She does not discern there is a leg length difference, but lying her down flat prior to surgery after spinal anesthesia was in, she shows that she is actually shorter on her right operative leg than the left.  However, x-ray wise in the office  shows a significant valgus malalignment of the right hip with more varus on the left hip.  Her leg lengths on radiographic assessment showed that she is longer on her right, but  clinically, she is shorter.  DESCRIPTION OF PROCEDURE:  After informed consent was obtained.  Appropriate right leg was marked.  She was brought to the operating room.  Spinal anesthesia was obtained while she was on a stretcher.  A Foley catheter was placed and again we measured  leg length that showed it was shorter on the operative right side.  She was laid supine on the Hana fracture table.  A perineal post was placed and both legs on a skeletal traction device and no traction applied.  The right operative hip was prepped and  draped with DuraPrep and sterile drapes.  Time out was called.  She identified as the correct patient, the correct right hip.  We then made an incision just inferior and posterior to the anterior superior iliac spine and carried this obliquely down the  leg.  We dissected down tensor fascia lata muscle.  The tensor fascia was then divided longitudinally to proceed with a right anterior approach to the hip.  We identified and cauterized the circumflex vessels, identified the hip capsule.  Over the hip  capsule in a type format, finding very large joint effusion and significant arthritis throughout her right hip.  We then placed a Cobra retractor on the medial and lateral femoral neck and made our femoral neck cut proximal to the lesser trochanter.   Completing this with an osteotome.  We placed a corkscrew guide in the femoral head and removed the femoral head in its entirety and found it to be completely devoid of cartilage in a wide area.  We then placed a bent Hohmann over the medial aspect of  the acetabular  rim and removed remnants of acetabular labrum and other debris.  I then began reaming from a size 43 reamer in stepwise increments up to a size 51 with all reamers under direct visualization, the last reamer under direct fluoroscopy, so we  could obtain depth of reaming, her inclination and anteversion.  We then placed the real DePuy Sector Gription acetabular component  size 52 with a single screw.  We placed a 36+0 polyethylene liner for that size 52 acetabular component.  Attention was  then turned to the femur.  With the leg externally rotated to 130 degrees, extended and adducted, we placed a Mueller retractor medially and Homan retractor to the greater trochanter, released the lateral joint capsule and used a box cutting osteotome  and then the femoral canal to rongeur to lateralize.  We then began broaching from a size 8 broach using the carotid broaching system up to a size 11.  With a size 11 in place, we trialed a standard offset femoral neck based on her previous anatomy at  36.5 head ball, reducing the acetabulum.  We were pleased with range of motion, stability, offset and leg length.  Again, radiographically looks like she is longer on this side and we did lengthen her more based on the clinical exam.  Again, she felt  stable and I did appreciate this leg length improvement.  We then dislocated the hip, removed the trial components.  We had placed the real Corail femoral component with a standard offset, size 11 and the real 36+1.5 head ball, reduced the acetabulum and  again I felt that she was stable and I appreciated the range of motion, leg length and stability in general.  We then irrigated the soft tissue with normal saline solution and closed the joint capsule with interrupted #1 Ethibond suture, followed by #1  Vicryl in the deep fascia, running #1 Vicryl in the tensor fascia with 0 Vicryl in the deep tissue, 2-0 Vicryl, subcutaneous tissue, 4-0 Monocryl subcuticular stitch and Steri-Strips on the skin.  An Aquacel dressing was applied.    She was taken off the table and taken to recovery room in stable condition.  All final counts were correct.  There were no complications noted.  Of note, Benita Stabile, PA-C assisted the entire case.  His assistance was crucial for facilitating all aspects  of this case.  AN/NUANCE  D:11/26/2017 T:11/26/2017  JOB:[id number]

## 2017-11-27 ENCOUNTER — Encounter (HOSPITAL_COMMUNITY): Payer: Self-pay | Admitting: Orthopaedic Surgery

## 2017-11-27 LAB — BASIC METABOLIC PANEL
ANION GAP: 8 (ref 5–15)
BUN: 11 mg/dL (ref 6–20)
CALCIUM: 8.2 mg/dL — AB (ref 8.9–10.3)
CO2: 25 mmol/L (ref 22–32)
CREATININE: 0.63 mg/dL (ref 0.44–1.00)
Chloride: 104 mmol/L (ref 101–111)
GFR calc Af Amer: >60 mL/min (ref >60)
GLUCOSE: 143 mg/dL — AB (ref 65–99)
Potassium: 4.1 mmol/L (ref 3.5–5.1)
Sodium: 137 mmol/L (ref 135–145)

## 2017-11-27 LAB — CBC
HCT: 30.8 % — ABNORMAL LOW (ref 36.0–46.0)
Hemoglobin: 10.2 g/dL — ABNORMAL LOW (ref 12.0–15.0)
MCH: 30.2 pg (ref 26.0–34.0)
MCHC: 33.1 g/dL (ref 30.0–36.0)
MCV: 91.1 fL (ref 78.0–100.0)
PLATELETS: 153 10*3/uL (ref 150–400)
RBC: 3.38 MIL/uL — ABNORMAL LOW (ref 3.87–5.11)
RDW: 12.1 % (ref 11.5–15.5)
WBC: 9.2 10*3/uL (ref 4.0–10.5)

## 2017-11-27 NOTE — Evaluation (Signed)
Occupational Therapy Evaluation Patient Details Name: Kelly Ayers MRN: [id number] DOB: 02/03/1946 Today's Date: 11/27/2017    History of Present Illness Pt is a 72 y/o female s/p R THA, direct anterior approach. PMH including but not limited to cancer, HTN and pre-diabetes.   Clinical Impression   PTA, pt was independent with ADL and functional mobility. Pt currently limited by R hip pain but she is motivated to participate with OT. She currently requires min assist for LB ADL and min guard assist for toilet transfers. She demonstrates limited activity tolerance for ADL participation. Educated pt concerning safe use of 3-in-1 over commode for elevated seat as well as compensatory LB ADL strategies. Pt would benefit from continued OT services while admitted to maximize independence and safety with ADL and functional mobility. Do not anticipate need for further OT services post-acute D/C. OT will continue to follow while admitted.     Follow Up Recommendations  Supervision/Assistance - 24 hour;No OT follow up    Equipment Recommendations  3 in 1 bedside commode    Recommendations for Other Services       Precautions / Restrictions Precautions Precautions: Fall Restrictions Weight Bearing Restrictions: Yes RLE Weight Bearing: Weight bearing as tolerated      Mobility Bed Mobility Overal bed mobility: Needs Assistance Bed Mobility: Sit to Supine;Supine to Sit     Supine to sit: Min guard Sit to supine: Min assist   General bed mobility comments: Min assist to manage R LE back to bed  Transfers Overall transfer level: Needs assistance Equipment used: Rolling walker (2 wheeled) Transfers: Sit to/from Stand Sit to Stand: Min guard         General transfer comment: increased time and effort, cueing for safe hand placement, min guard for safety    Balance Overall balance assessment: Needs assistance Sitting-balance support: Feet supported Sitting balance-Leahy Scale:  Good     Standing balance support: No upper extremity supported;During functional activity;Bilateral upper extremity supported Standing balance-Leahy Scale: Fair Standing balance comment: Able to stand at sink for grooming tasks without UE support.                            ADL either performed or assessed with clinical judgement   ADL Overall ADL's : Needs assistance/impaired Eating/Feeding: Set up;Sitting   Grooming: Supervision/safety;Standing   Upper Body Bathing: Set up;Sitting   Lower Body Bathing: Minimal assistance;Sit to/from stand   Upper Body Dressing : Set up;Sitting   Lower Body Dressing: Minimal assistance;Sit to/from stand   Toilet Transfer: Min guard;Ambulation;RW;BSC Toilet Transfer Details (indicate cue type and reason): BSC over toilet Toileting- Clothing Manipulation and Hygiene: Supervision/safety;Sit to/from stand       Functional mobility during ADLs: Rolling walker;Min guard General ADL Comments: Pt educated concerning compensatory LB ADL strategies and safe toilet transfers.      Vision Patient Visual Report: No change from baseline Vision Assessment?: No apparent visual deficits     Perception     Praxis      Pertinent Vitals/Pain Pain Assessment: Faces Faces Pain Scale: Hurts even more Pain Location: R hip Pain Descriptors / Indicators: Sore;Grimacing;Guarding Pain Intervention(s): Limited activity within patient's tolerance;Monitored during session;Repositioned     Hand Dominance     Extremity/Trunk Assessment Upper Extremity Assessment Upper Extremity Assessment: Overall WFL for tasks assessed   Lower Extremity Assessment Lower Extremity Assessment: RLE deficits/detail RLE Deficits / Details: Decreased strength and ROM as expected post-operatively.  Cervical / Trunk Assessment Cervical / Trunk Assessment: Normal   Communication Communication Communication: No difficulties   Cognition Arousal/Alertness:  Awake/alert Behavior During Therapy: WFL for tasks assessed/performed Overall Cognitive Status: Within Functional Limits for tasks assessed                                     General Comments       Exercises Exercises: Total Joint Total Joint Exercises Ankle Circles/Pumps: AROM;Both;20 reps;Supine Quad Sets: AROM;Strengthening;Right;10 reps;Supine Gluteal Sets: AROM;Strengthening;Both;10 reps;Supine Heel Slides: AROM;Strengthening;Right;10 reps;Supine   Shoulder Instructions      Home Living Family/patient expects to be discharged to:: Private residence Living Arrangements: Spouse/significant other Available Help at Discharge: Family;Available 24 hours/day Type of Home: House Home Access: Stairs to enter [employer] of Steps: 5 Entrance Stairs-Rails: Right;Left Home Layout: One level     Bathroom Shower/Tub: Teacher, early years/pre: Standard     Home Equipment: Environmental consultant - 2 wheels;Bedside commode;Tub bench          Prior Functioning/Environment Level of Independence: Independent        Comments: 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        OT Problem List: Decreased strength;Decreased range of motion;Decreased activity tolerance;Impaired balance (sitting and/or standing);Pain;Decreased safety awareness;Decreased knowledge of use of DME or AE;Decreased knowledge of precautions      OT Treatment/Interventions: Self-care/ADL training;Therapeutic exercise;Energy conservation;DME and/or AE instruction;Therapeutic activities;Patient/family education;Balance training    OT Goals(Current goals can be found in the care plan section) Acute Rehab OT Goals Patient Stated Goal: return home OT Goal Formulation: With patient Time For Goal Achievement: 12/11/17 Potential to Achieve Goals: Good ADL Goals Pt Will Perform Lower Body Dressing: sit to/from stand;with min guard assist Pt Will Transfer to Toilet: with modified  independence;ambulating;bedside commode(BSC over toilet) Pt Will Perform Toileting - Clothing Manipulation and hygiene: with modified independence;sit to/from stand Pt Will Perform Tub/Shower Transfer: with supervision;ambulating;Tub transfer;tub bench;rolling walker  OT Frequency: Min 2X/week   Barriers to D/C:            Co-evaluation              AM-PAC PT "6 Clicks" Daily Activity     Outcome Measure Help from another person eating meals?: None Help from another person taking care of personal grooming?: A Little Help from another person toileting, which includes using toliet, bedpan, or urinal?: A Little Help from another person bathing (including washing, rinsing, drying)?: A Little Help from another person to put on and taking off regular upper body clothing?: None Help from another person to put on and taking off regular lower body clothing?: A Little 6 Click Score: 20   End of Session Equipment Utilized During Treatment: Rolling walker;Gait belt Nurse Communication: Mobility status(recommendation for ambulation to bathroom to void with staff)  Activity Tolerance: Patient tolerated treatment well Patient left: in bed;with call bell/phone within reach;with family/visitor present  OT Visit Diagnosis: Other abnormalities of gait and mobility (R26.89);Pain Pain - Right/Left: Right Pain - part of body: Hip                Time: 4696-2952 OT Time Calculation (min): 24 min Charges:  OT General Charges $OT Visit: 1 Visit OT Evaluation $OT Eval Moderate Complexity: 1 Mod OT Treatments $Self Care/Home Management : 8-22 mins G-Codes:     Norman Herrlich, MS OTR/L  Pager: [place]  A Daryus Sowash 11/27/2017, 5:30 PM

## 2017-11-27 NOTE — Progress Notes (Signed)
Physical Therapy Treatment Patient Details Name: Kelly Ayers MRN: [id number] DOB: July 17, 1945 Today's Date: 11/27/2017    History of Present Illness Pt is a 72 y/o female s/p R THA, direct anterior approach. PMH including but not limited to cancer, HTN and pre-diabetes.    PT Comments    Pt making good progress with functional mobility and tolerated ambulating an increased distance this session. Will attempt stair training at next session if appropriate. Pt's husband present throughout this session as well. Pt would continue to benefit from skilled physical therapy services at this time while admitted and after d/c to address the below listed limitations in order to improve overall safety and independence with functional mobility.   Follow Up Recommendations  Home health PT;Supervision/Assistance - 24 hour     Equipment Recommendations  None recommended by PT    Recommendations for Other Services       Precautions / Restrictions Precautions Precautions: Fall Restrictions Weight Bearing Restrictions: Yes RLE Weight Bearing: Weight bearing as tolerated    Mobility  Bed Mobility Overal bed mobility: Needs Assistance Bed Mobility: Sit to Supine       Sit to supine: Min assist   General bed mobility comments: increased time and effort, assistance with returning R LE onto bed  Transfers Overall transfer level: Needs assistance Equipment used: Rolling walker (2 wheeled) Transfers: Sit to/from Stand Sit to Stand: Min guard         General transfer comment: increased time and effort, cueing for safe hand placement, min guard for safety  Ambulation/Gait Ambulation/Gait assistance: Min guard Ambulation Distance (Feet): 40 Feet Assistive device: Rolling walker (2 wheeled) Gait Pattern/deviations: Step-to pattern;Step-through pattern;Decreased step length - right;Decreased step length - left;Decreased stride length;Decreased weight shift to right;Antalgic Gait velocity:  decreased Gait velocity interpretation: <1.31 ft/sec, indicative of household ambulator General Gait Details: pt with mild instability but no overt LOB or need for physical assistance, cueing for sequencing with RW   Stairs             Wheelchair Mobility    Modified Rankin (Stroke Patients Only)       Balance Overall balance assessment: Needs assistance Sitting-balance support: Feet supported Sitting balance-Leahy Scale: Good     Standing balance support: During functional activity;Bilateral upper extremity supported Standing balance-Leahy Scale: Poor Standing balance comment: reliant on RW                            Cognition Arousal/Alertness: Awake/alert Behavior During Therapy: WFL for tasks assessed/performed Overall Cognitive Status: Within Functional Limits for tasks assessed                                        Exercises Total Joint Exercises Ankle Circles/Pumps: AROM;Both;20 reps;Supine Quad Sets: AROM;Strengthening;Right;10 reps;Supine Gluteal Sets: AROM;Strengthening;Both;10 reps;Supine Heel Slides: AROM;Strengthening;Right;10 reps;Supine    General Comments        Pertinent Vitals/Pain Pain Assessment: Faces Faces Pain Scale: Hurts even more Pain Location: R hip Pain Descriptors / Indicators: Sore;Grimacing;Guarding Pain Intervention(s): Monitored during session;Repositioned    Home Living                      Prior Function            PT Goals (current goals can now be found in the care plan section) Acute Rehab PT Goals  PT Goal Formulation: With patient Time For Goal Achievement: 12/11/17 Potential to Achieve Goals: Good Progress towards PT goals: Progressing toward goals    Frequency    7X/week      PT Plan Current plan remains appropriate    Co-evaluation              AM-PAC PT "6 Clicks" Daily Activity  Outcome Measure  Difficulty turning over in bed (including adjusting  bedclothes, sheets and blankets)?: Unable Difficulty moving from lying on back to sitting on the side of the bed? : Unable Difficulty sitting down on and standing up from a chair with arms (e.g., wheelchair, bedside commode, etc,.)?: Unable Help needed moving to and from a bed to chair (including a wheelchair)?: A Little Help needed walking in hospital room?: A Little Help needed climbing 3-5 steps with a railing? : A Little 6 Click Score: 12    End of Session Equipment Utilized During Treatment: Gait belt Activity Tolerance: Patient limited by pain;Patient limited by fatigue Patient left: in bed;with call bell/phone within reach;with family/visitor present;with SCD's reapplied Nurse Communication: Mobility status PT Visit Diagnosis: Other abnormalities of gait and mobility (R26.89);Pain Pain - Right/Left: Right Pain - part of body: Hip     Time: 8182-9937 PT Time Calculation (min) (ACUTE ONLY): 29 min  Charges:  $Gait Training: 8-22 mins $Therapeutic Activity: 8-22 mins                    G Codes:       [place], [place], [place] 11/27/2017, 4:57 PM

## 2017-11-27 NOTE — Plan of Care (Signed)
  Problem: Education: Goal: Knowledge of General Education information will improve Outcome: Progressing   Problem: Clinical Measurements: Goal: Ability to maintain clinical measurements within normal limits will improve Outcome: Progressing   Problem: Clinical Measurements: Goal: Will remain free from infection Outcome: Progressing   Problem: Activity: Goal: Risk for activity intolerance will decrease Outcome: Progressing   Problem: Pain Managment: Goal: General experience of comfort will improve Outcome: Progressing   

## 2017-11-27 NOTE — Progress Notes (Signed)
Subjective: 1 Day Post-Op Procedure(s) (LRB): RIGHT TOTAL HIP ARTHROPLASTY ANTERIOR APPROACH (Right) Patient reports pain as moderate.  Mild acute blood loss anemia from surgery, but tolerating well.  Objective: Vital signs in last 24 hours: Temp:  [97 F (36.1 C)-98.3 F (36.8 C)] 97.8 F (36.6 C) (05/29 0506) Pulse Rate:  [52-78] 64 (05/29 0506) Resp:  [9-20] 16 (05/28 1803) BP: (100-167)/(49-78) 109/49 (05/29 0506) SpO2:  [93 %-100 %] 93 % (05/29 0506)  Intake/Output from previous day: 05/28 0701 - 05/29 0700 In: 1750 [I.V.:1700; IV Piggyback:50] Out: 1380 [Urine:1030; Blood:350] Intake/Output this shift: No intake/output data recorded.  Recent Labs    11/27/17 0516  HGB 10.2*   Recent Labs    11/27/17 0516  WBC 9.2  RBC 3.38*  HCT 30.8*  PLT 153   Recent Labs    11/27/17 0516  NA 137  K 4.1  CL 104  CO2 25  BUN 11  CREATININE 0.63  GLUCOSE 143*  CALCIUM 8.2*   No results for input(s): LABPT, INR in the last 72 hours.  Sensation intact distally Intact pulses distally Dorsiflexion/Plantar flexion intact Incision: dressing C/D/I  Assessment/Plan: 1 Day Post-Op Procedure(s) (LRB): RIGHT TOTAL HIP ARTHROPLASTY ANTERIOR APPROACH (Right) Up with therapy    Kelly Ayers 11/27/2017, 7:16 AM

## 2017-11-27 NOTE — Care Management Note (Signed)
Case Management Note  Patient Details  Name: Kelly Ayers MRN: [id number] Date of Birth: [birth year]  Subjective/Objective:  71 yr old female s/p right total hip arthroplasty.                  Action/Plan: Case manager spoke with patient and her husband concerning discharge plan and DME. Choice for [facility] was offered, referral was called to [facility]ia[facility]. [name] says that they hav RW, 3in1 and shower chair from his surgery. Patient will discharge home with family support.   Expected Discharge Date:    11/28/17              Expected Discharge Plan:  [place]  In-House Referral:  NA  Discharge planning Services  CM Consult  Post Acute Care Choice:  Home Health Choice offered to:  Patient, Spouse  DME Arranged:  N/A(Has RW, 3in1 and shower chair) DME Agency:  NA  HH Arranged:  PT HH Agency:  [facility]  Status of Service:  Completed, signed off  If discussed at [place] of Stay Meetings, dates discussed:    Additional Comments:  Ninfa Meeker, RN 11/27/2017, 3:33 PM

## 2017-11-27 NOTE — Evaluation (Signed)
Physical Therapy Evaluation Patient Details Name: Kelly Ayers MRN: [id number] DOB: 1946/05/31 Today's Date: 11/27/2017   History of Present Illness  Pt is a 72 y/o female s/p R THA, direct anterior approach. PMH including but not limited to cancer, HTN and pre-diabetes.    Clinical Impression  Pt presented supine in bed with HOB elevated, awake and willing to participate in therapy session. Prior to admission, pt reported that she was independent with all functional mobility and ADLs. Pt lives with her husband who is able to provide 24/7 supervision/assistance upon discharge if needed. Pt currently requires min A for bed mobility, min guard for transfers and min guard to ambulate a very short distance within her room. Pt limited secondary to feeling dizzy/lightheaded. However, overall moving well and will likely make good progress with next therapy session this PM.  Pt would continue to benefit from skilled physical therapy services at this time while admitted and after d/c to address the below listed limitations in order to improve overall safety and independence with functional mobility.     Follow Up Recommendations Home health PT;Supervision/Assistance - 24 hour    Equipment Recommendations  None recommended by PT    Recommendations for Other Services       Precautions / Restrictions Precautions Precautions: Fall Restrictions Weight Bearing Restrictions: Yes RLE Weight Bearing: Weight bearing as tolerated      Mobility  Bed Mobility Overal bed mobility: Needs Assistance Bed Mobility: Supine to Sit     Supine to sit: Min assist     General bed mobility comments: increased time and effort, assistance with R LE movement off of bed and with trunk elevation to achieve sitting upright at EOB  Transfers Overall transfer level: Needs assistance Equipment used: Rolling walker (2 wheeled) Transfers: Sit to/from Stand Sit to Stand: Min guard         General transfer  comment: increased time and effort, cueing for safe hand placement, min guard for safety  Ambulation/Gait Ambulation/Gait assistance: Min guard Ambulation Distance (Feet): 5 Feet Assistive device: Rolling walker (2 wheeled) Gait Pattern/deviations: Step-to pattern;Step-through pattern;Decreased step length - right;Decreased step length - left;Decreased stride length;Decreased weight shift to right;Antalgic Gait velocity: decreased Gait velocity interpretation: <1.31 ft/sec, indicative of household ambulator General Gait Details: pt with mild instability but no overt LOB or need for physical assistance, min guard for safety; however, pt very limited secondary to feeling lightheaded and dizzy. Pt returned to sitting and cold compress applied to her forehead.  Stairs            Wheelchair Mobility    Modified Rankin (Stroke Patients Only)       Balance Overall balance assessment: Needs assistance Sitting-balance support: Feet supported Sitting balance-Leahy Scale: Good     Standing balance support: During functional activity;Bilateral upper extremity supported Standing balance-Leahy Scale: Poor Standing balance comment: reliant on RW                             Pertinent Vitals/Pain Pain Assessment: 0-10 Pain Score: 6  Pain Location: R hip Pain Descriptors / Indicators: Sore;Grimacing;Guarding Pain Intervention(s): Monitored during session;Repositioned    Home Living Family/patient expects to be discharged to:: Private residence Living Arrangements: Spouse/significant other Available Help at Discharge: Family;Available 24 hours/day Type of Home: House Home Access: Stairs to enter Entrance Stairs-Rails: Psychiatric nurse of Steps: 5 Home Layout: One level Home Equipment: Walker - 2 wheels;Bedside commode  Prior Function Level of Independence: Independent         Comments: continues to work at a [employer]        Extremity/Trunk Assessment   Upper Extremity Assessment Upper Extremity Assessment: Defer to OT evaluation    Lower Extremity Assessment Lower Extremity Assessment: RLE deficits/detail RLE Deficits / Details: pt with decreased strength and AROM limitations secondary to post-op pain; sensation grossly intact    Cervical / Trunk Assessment Cervical / Trunk Assessment: Normal  Communication   Communication: No difficulties  Cognition Arousal/Alertness: Awake/alert Behavior During Therapy: WFL for tasks assessed/performed Overall Cognitive Status: Within Functional Limits for tasks assessed                                        General Comments      Exercises     Assessment/Plan    PT Assessment Patient needs continued PT services  PT Problem List Decreased strength;Decreased range of motion;Decreased activity tolerance;Decreased balance;Decreased mobility;Decreased coordination;Decreased knowledge of use of DME;Decreased safety awareness;Decreased knowledge of precautions;Pain       PT Treatment Interventions DME instruction;Gait training;Stair training;Functional mobility training;Therapeutic activities;Therapeutic exercise;Balance training;Neuromuscular re-education;Patient/family education    PT Goals (Current goals can be found in the Care Plan section)  Acute Rehab PT Goals Patient Stated Goal: return home PT Goal Formulation: With patient Time For Goal Achievement: 12/11/17 Potential to Achieve Goals: Good    Frequency 7X/week   Barriers to discharge        Co-evaluation               AM-PAC PT "6 Clicks" Daily Activity  Outcome Measure Difficulty turning over in bed (including adjusting bedclothes, sheets and blankets)?: Unable Difficulty moving from lying on back to sitting on the side of the bed? : Unable Difficulty sitting down on and standing up from a chair with arms (e.g., wheelchair, bedside commode, etc,.)?:  Unable Help needed moving to and from a bed to chair (including a wheelchair)?: A Little Help needed walking in hospital room?: A Little Help needed climbing 3-5 steps with a railing? : A Little 6 Click Score: 12    End of Session Equipment Utilized During Treatment: Gait belt Activity Tolerance: Patient limited by fatigue;Patient limited by pain Patient left: in chair;with call bell/phone within reach Nurse Communication: Mobility status;Other (comment)(feeling lightheaded and dizzy with standing/ambulation) PT Visit Diagnosis: Other abnormalities of gait and mobility (R26.89);Pain Pain - Right/Left: Right Pain - part of body: Hip    Time: 1194-1740 PT Time Calculation (min) (ACUTE ONLY): 21 min   Charges:   PT Evaluation $PT Eval Moderate Complexity: 1 Mod     PT G Codes:        Martha [place], PT, DPT [place] 11/27/2017, 11:14 AM

## 2017-11-28 MED ORDER — ASPIRIN 81 MG PO CHEW: 81.0000 mg | CHEWABLE_TABLET | Freq: Two times a day (BID) | ORAL | 0 refills | Status: AC

## 2017-11-28 MED ORDER — METHOCARBAMOL 500 MG PO TABS: 500.0000 mg | ORAL_TABLET | Freq: Four times a day (QID) | ORAL | 0 refills | Status: AC | PRN

## 2017-11-28 MED ORDER — OXYCODONE HCL 5 MG PO TABS: 5.0000 mg | ORAL_TABLET | ORAL | 0 refills | Status: AC | PRN

## 2017-11-28 NOTE — Progress Notes (Signed)
Occupational Therapy Treatment Patient Details Name: Kelly Ayers MRN: [id number] DOB: 13-Apr-1946 Today's Date: 11/28/2017    History of present illness Pt is a 72 y/o female s/p R THA, direct anterior approach. PMH including but not limited to cancer, HTN and pre-diabetes.   OT comments  Educated in use of tub bench in which pt already has at home  Follow Up Recommendations  Supervision/Assistance - 24 hour;No OT follow up    Equipment Recommendations  3 in 1 bedside commode    Recommendations for Other Services      Precautions / Restrictions Precautions Precautions: Fall Restrictions Weight Bearing Restrictions: Yes RLE Weight Bearing: Weight bearing as tolerated       Mobility Bed Mobility   Bed Mobility: Sit to Supine       Sit to supine: Min guard   General bed mobility comments: VC for sequencing  Transfers Overall transfer level: Needs assistance Equipment used: Rolling walker (2 wheeled) Transfers: Sit to/from [facility] Sit to Stand: Min guard Stand pivot transfers: Min guard       General transfer comment: increased time and effort, good technique, min guard for safety    Balance Overall balance assessment: Needs assistance Sitting-balance support: Feet supported Sitting balance-Leahy Scale: Good     Standing balance support: No upper extremity supported;During functional activity;Bilateral upper extremity supported Standing balance-Leahy Scale: Fair                             ADL either performed or assessed with clinical judgement   ADL Overall ADL's : Needs assistance/impaired                     Lower Body Dressing: Minimal assistance;Sit to/from stand Lower Body Dressing Details (indicate cue type and reason): VC for sequencing and technique. Husband will A as needed Toilet Transfer: Min guard;RW;Comfort height toilet;Ambulation   Toileting- Clothing Manipulation and Hygiene: Minimal  assistance;Sit to/from stand   Tub/ Shower Transfer: Tub transfer;Tub Buyer, retail Details (indicate cue type and reason): Educated pt on use of tub bench.  Pt has one at home. Pt and husband verbalized understanding         Vision Patient Visual Report: No change from baseline     Perception     Praxis      Cognition Arousal/Alertness: Awake/alert Behavior During Therapy: WFL for tasks assessed/performed Overall Cognitive Status: Within Functional Limits for tasks assessed                                                General Comments      Pertinent Vitals/ Pain       Pain Score: 3  Pain Location: R hip Pain Descriptors / Indicators: Sore Pain Intervention(s): Limited activity within patient's tolerance;Monitored during session;Repositioned;Ice applied  Home Living                                          Prior Functioning/Environment              Frequency  Min 2X/week        Progress Toward Goals  OT Goals(current goals can now be found in the care plan section)  Progress towards OT goals: Progressing toward goals     Plan Discharge plan remains appropriate    Co-evaluation                 AM-PAC PT "6 Clicks" Daily Activity     Outcome Measure   Help from another person eating meals?: None Help from another person taking care of personal grooming?: A Little Help from another person toileting, which includes using toliet, bedpan, or urinal?: A Little Help from another person bathing (including washing, rinsing, drying)?: A Little Help from another person to put on and taking off regular upper body clothing?: None Help from another person to put on and taking off regular lower body clothing?: A Little 6 Click Score: 20    End of Session Equipment Utilized During Treatment: Rolling walker;Gait belt  OT Visit Diagnosis: Other abnormalities of gait and mobility (R26.89);Pain Pain -  Right/Left: Right Pain - part of body: Hip   Activity Tolerance Patient tolerated treatment well   Patient Left in bed;with call bell/phone within reach;with family/visitor present   Nurse Communication Mobility status(recommendation for ambulation to bathroom to void with staff)        Time: 7124-5809 OT Time Calculation (min): 17 min  Charges: OT General Charges $OT Visit: 1 Visit OT Treatments $Self Care/Home Management : 8-22 mins  [place], [place]   Kelly Ayers 11/28/2017, 2:15 PM

## 2017-11-28 NOTE — Progress Notes (Signed)
Subjective: 2 Days Post-Op Procedure(s) (LRB): RIGHT TOTAL HIP ARTHROPLASTY ANTERIOR APPROACH (Right) Patient reports pain as moderate.    Objective: Vital signs in last 24 hours: Temp:  [98.6 F (37 C)-99.5 F (37.5 C)] 99.5 F (37.5 C) (05/30 0422) Pulse Rate:  [78-88] 78 (05/30 0422) Resp:  [18-20] 18 (05/29 2013) BP: (120-154)/(63-79) 120/63 (05/30 0422) SpO2:  [96 %-98 %] 98 % (05/30 0422)  Intake/Output from previous day: 05/29 0701 - 05/30 0700 In: 960 [P.O.:960] Out: -  Intake/Output this shift: No intake/output data recorded.  Recent Labs    11/27/17 0516  HGB 10.2*   Recent Labs    11/27/17 0516  WBC 9.2  RBC 3.38*  HCT 30.8*  PLT 153   Recent Labs    11/27/17 0516  NA 137  K 4.1  CL 104  CO2 25  BUN 11  CREATININE 0.63  GLUCOSE 143*  CALCIUM 8.2*   No results for input(s): LABPT, INR in the last 72 hours.  Sensation intact distally Intact pulses distally Dorsiflexion/Plantar flexion intact Incision: scant drainage  Assessment/Plan: 2 Days Post-Op Procedure(s) (LRB): RIGHT TOTAL HIP ARTHROPLASTY ANTERIOR APPROACH (Right) Up with therapy Plan for discharge tomorrow Discharge home with home health    Mcarthur Rossetti 11/28/2017, 11:38 AM

## 2017-11-28 NOTE — Progress Notes (Signed)
Physical Therapy Treatment Patient Details Name: Kelly Ayers MRN: [id number] DOB: 02-25-1946 Today's Date: 11/28/2017    History of Present Illness Pt is a 72 y/o female s/p R THA, direct anterior approach. PMH including but not limited to cancer, HTN and pre-diabetes.    PT Comments    Pt making steady progress with functional mobility and successfully completed stair training this session. Pt is ready to d/c home from PT perspective.   Pt would continue to benefit from skilled physical therapy services at this time while admitted and after d/c to address the below listed limitations in order to improve overall safety and independence with functional mobility.   Follow Up Recommendations  Home health PT;Supervision/Assistance - 24 hour     Equipment Recommendations  None recommended by PT    Recommendations for Other Services       Precautions / Restrictions Precautions Precautions: Fall Restrictions Weight Bearing Restrictions: Yes RLE Weight Bearing: Weight bearing as tolerated    Mobility  Bed Mobility Overal bed mobility: Needs Assistance Bed Mobility: Supine to Sit;Sit to Supine     Supine to sit: Min assist Sit to supine: Min assist   General bed mobility comments: increased time and effort, min A for R LE movement  Transfers Overall transfer level: Needs assistance Equipment used: Rolling walker (2 wheeled) Transfers: Sit to/from Stand Sit to Stand: Min guard Stand pivot transfers: Min guard       General transfer comment: increased time and effort, good technique, min guard for safety  Ambulation/Gait Ambulation/Gait assistance: Min guard Ambulation Distance (Feet): 100 Feet Assistive device: Rolling walker (2 wheeled) Gait Pattern/deviations: Step-to pattern;Step-through pattern;Decreased step length - right;Decreased step length - left;Decreased stride length;Decreased weight shift to right;Antalgic Gait velocity: decreased Gait velocity  interpretation: 1.31 - 2.62 ft/sec, indicative of limited community ambulator General Gait Details: pt with mild instability but no overt LOB or need for physical assistance, cueing for sequencing with RW   Stairs Stairs: Yes Stairs assistance: Min guard Stair Management: Two rails;Step to pattern;Forwards Number of Stairs: 2(x2 bouts) General stair comments: min guard for safety, cueing for sequencing   Wheelchair Mobility    Modified Rankin (Stroke Patients Only)       Balance Overall balance assessment: Needs assistance Sitting-balance support: Feet supported Sitting balance-Leahy Scale: Good     Standing balance support: No upper extremity supported;During functional activity;Bilateral upper extremity supported Standing balance-Leahy Scale: Fair                              Cognition Arousal/Alertness: Awake/alert Behavior During Therapy: WFL for tasks assessed/performed Overall Cognitive Status: Within Functional Limits for tasks assessed                                        Exercises      General Comments        Pertinent Vitals/Pain Pain Assessment: Faces Pain Score: 3  Faces Pain Scale: Hurts a little bit Pain Location: R hip Pain Descriptors / Indicators: Sore Pain Intervention(s): Monitored during session;Repositioned    Home Living                      Prior Function            PT Goals (current goals can now be found in the care plan section)  Acute Rehab PT Goals PT Goal Formulation: With patient Time For Goal Achievement: 12/11/17 Potential to Achieve Goals: Good Progress towards PT goals: Progressing toward goals    Frequency    7X/week      PT Plan Current plan remains appropriate    Co-evaluation              AM-PAC PT "6 Clicks" Daily Activity  Outcome Measure  Difficulty turning over in bed (including adjusting bedclothes, sheets and blankets)?: Unable Difficulty moving from  lying on back to sitting on the side of the bed? : Unable Difficulty sitting down on and standing up from a chair with arms (e.g., wheelchair, bedside commode, etc,.)?: Unable Help needed moving to and from a bed to chair (including a wheelchair)?: None Help needed walking in hospital room?: A Little Help needed climbing 3-5 steps with a railing? : A Little 6 Click Score: 13    End of Session Equipment Utilized During Treatment: Gait belt Activity Tolerance: Patient limited by pain;Patient limited by fatigue Patient left: in chair;with call bell/phone within reach Nurse Communication: Mobility status PT Visit Diagnosis: Other abnormalities of gait and mobility (R26.89);Pain Pain - Right/Left: Right Pain - part of body: Hip     Time: 8937-3428 PT Time Calculation (min) (ACUTE ONLY): 19 min  Charges:  $Gait Training: 8-22 mins                    G Codes:       [place], [place], [place] 11/28/2017, 3:14 PM

## 2017-11-28 NOTE — Discharge Instructions (Signed)

## 2017-11-28 NOTE — Progress Notes (Signed)
Physical Therapy Treatment Patient Details Name: Kelly Ayers MRN: [id number] DOB: June 21, 1946 Today's Date: 11/28/2017    History of Present Illness Pt is a 72 y/o female s/p R THA, direct anterior approach. PMH including but not limited to cancer, HTN and pre-diabetes.    PT Comments    Focus of session was on LE strengthening therex (see below). Pt with improved gait pattern this as compared to yesterday. Will attempt stair training at next session this PM if appropriate.   Pt would continue to benefit from skilled physical therapy services at this time while admitted and after d/c to address the below listed limitations in order to improve overall safety and independence with functional mobility.    Follow Up Recommendations  Home health PT;Supervision/Assistance - 24 hour     Equipment Recommendations  None recommended by PT    Recommendations for Other Services       Precautions / Restrictions Precautions Precautions: Fall Restrictions Weight Bearing Restrictions: Yes RLE Weight Bearing: Weight bearing as tolerated    Mobility  Bed Mobility               General bed mobility comments: pt OOB in recliner chair upon arrival  Transfers Overall transfer level: Needs assistance Equipment used: Rolling walker (2 wheeled) Transfers: Sit to/from Stand Sit to Stand: Min guard         General transfer comment: increased time and effort, good technique, min guard for safety  Ambulation/Gait Ambulation/Gait assistance: Min guard Ambulation Distance (Feet): 40 Feet Assistive device: Rolling walker (2 wheeled) Gait Pattern/deviations: Step-to pattern;Step-through pattern;Decreased step length - right;Decreased step length - left;Decreased stride length;Decreased weight shift to right;Antalgic Gait velocity: decreased   General Gait Details: pt with mild instability but no overt LOB or need for physical assistance, cueing for sequencing with RW   Stairs             Wheelchair Mobility    Modified Rankin (Stroke Patients Only)       Balance Overall balance assessment: Needs assistance Sitting-balance support: Feet supported Sitting balance-Leahy Scale: Good     Standing balance support: No upper extremity supported;During functional activity;Bilateral upper extremity supported Standing balance-Leahy Scale: Fair                              Cognition Arousal/Alertness: Awake/alert Behavior During Therapy: WFL for tasks assessed/performed Overall Cognitive Status: Within Functional Limits for tasks assessed                                        Exercises Total Joint Exercises Hip ABduction/ADduction: AROM;Strengthening;Right;10 reps;Standing Long Arc Quad: AROM;Strengthening;Right;10 reps;Seated Marching in Standing: AROM;Strengthening;Right;10 reps;Standing Standing Hip Extension: AROM;Strengthening;Right;10 reps;Standing    General Comments        Pertinent Vitals/Pain Pain Assessment: Faces Faces Pain Scale: Hurts little more Pain Location: R hip Pain Descriptors / Indicators: Sore Pain Intervention(s): Monitored during session;Repositioned    Home Living                      Prior Function            PT Goals (current goals can now be found in the care plan section) Acute Rehab PT Goals PT Goal Formulation: With patient Time For Goal Achievement: 12/11/17 Potential to Achieve Goals: Good Progress towards PT goals: Progressing  toward goals    Frequency    7X/week      PT Plan Current plan remains appropriate    Co-evaluation              AM-PAC PT "6 Clicks" Daily Activity  Outcome Measure  Difficulty turning over in bed (including adjusting bedclothes, sheets and blankets)?: Unable Difficulty moving from lying on back to sitting on the side of the bed? : Unable Difficulty sitting down on and standing up from a chair with arms (e.g., wheelchair,  bedside commode, etc,.)?: Unable Help needed moving to and from a bed to chair (including a wheelchair)?: None Help needed walking in hospital room?: A Little Help needed climbing 3-5 steps with a railing? : A Little 6 Click Score: 13    End of Session   Activity Tolerance: Patient limited by pain;Patient limited by fatigue Patient left: in chair;with call bell/phone within reach Nurse Communication: Mobility status PT Visit Diagnosis: Other abnormalities of gait and mobility (R26.89);Pain Pain - Right/Left: Right Pain - part of body: Hip     Time: 9983-3825 PT Time Calculation (min) (ACUTE ONLY): 16 min  Charges:  $Therapeutic Activity: 8-22 mins                    G Codes:       [place], [place], DPT [phone]    [place] 11/28/2017, 10:23 AM

## 2017-11-29 NOTE — Progress Notes (Signed)
Patient ID: Kelly Ayers, female   DOB: October 23, 1945, 72 y.o.   MRN: [id number] Doing well overall.  Can be discharged to home today.

## 2017-11-29 NOTE — Discharge Summary (Signed)
Patient ID: Rachna Schonberger MRN: [id number] DOB/AGE: [birth year] 72 y.o.  Admit date: 11/26/2017 Discharge date: 11/29/2017  Admission Diagnoses:  Principal Problem:   Osteoarthritis of right hip Active Problems:   Status post total replacement of right hip   Discharge Diagnoses:  Same  Past Medical History:  Diagnosis Date  . Anemia    after hysterectomy; had to have blood transfusion; no current issues  . Cancer ([place])   . High cholesterol   . Hypertension   . Osteoarthritis of hip   . Pre-diabetes     Surgeries: Procedure(s): RIGHT TOTAL HIP ARTHROPLASTY ANTERIOR APPROACH on 11/26/2017   Consultants:   Discharged Condition: Improved  Hospital Course: Rexanne Inocencio is an 72 y.o. female who was admitted 11/26/2017 for operative treatment ofOsteoarthritis of right hip. Patient has severe unremitting pain that affects sleep, daily activities, and work/hobbies. After pre-op clearance the patient was taken to the operating room on 11/26/2017 and underwent  Procedure(s): RIGHT TOTAL HIP ARTHROPLASTY ANTERIOR APPROACH.    Patient was given perioperative antibiotics:  Anti-infectives (From admission, onward)   Start     Dose/Rate Route Frequency Ordered Stop   11/26/17 1830  ceFAZolin (ANCEF) IVPB 1 g/50 mL premix     1 g 100 mL/hr over 30 Minutes Intravenous Every 6 hours 11/26/17 1801 11/27/17 0629   11/26/17 1000  ceFAZolin (ANCEF) IVPB 2g/100 mL premix     2 g 200 mL/hr over 30 Minutes Intravenous On call to O.R. 11/26/17 0955 11/26/17 1213       Patient was given sequential compression devices, early ambulation, and chemoprophylaxis to prevent DVT.  Patient benefited maximally from hospital stay and there were no complications.    Recent vital signs:  Patient Vitals for the past 24 hrs:  BP Temp Temp src Pulse Resp SpO2  11/29/17 0603 132/65 99.4 F (37.4 C) Oral 79 17 94 %  11/28/17 1953 136/66 (!) 100.6 F (38.1 C) Oral 91 18 96 %  11/28/17 1621 134/66 (!)  100.6 F (38.1 C) Oral 85 - 94 %     Recent laboratory studies:  Recent Labs    11/27/17 0516  WBC 9.2  HGB 10.2*  HCT 30.8*  PLT 153  NA 137  K 4.1  CL 104  CO2 25  BUN 11  CREATININE 0.63  GLUCOSE 143*  CALCIUM 8.2*     Discharge Medications:   Allergies as of 11/29/2017   No Known Allergies     Medication List    TAKE these medications   aspirin 81 MG chewable tablet Chew 1 tablet (81 mg total) by mouth 2 (two) times daily.   JUICE PLUS FIBRE PO Take 8 tablets by mouth daily. Take 2 tablets of [employer], 2 tablets Veggie Blend, 2 tablet [employer], & 2 tablets Omega Blend once daily   lisinopril 10 MG tablet Commonly known as:  PRINIVIL,ZESTRIL Take 10 mg by mouth daily.   methocarbamol 500 MG tablet Commonly known as:  ROBAXIN Take 1 tablet (500 mg total) by mouth every 6 (six) hours as needed for muscle spasms.   oxyCODONE 5 MG immediate release tablet Commonly known as:  Oxy IR/ROXICODONE Take 1-2 tablets (5-10 mg total) by mouth every 4 (four) hours as needed for moderate pain (pain score 4-6).   rosuvastatin 10 MG tablet Commonly known as:  CRESTOR Take 10 mg by mouth daily.            Durable Medical Equipment  (From admission, onward)  Start     Ordered   11/26/17 1802  DME 3 n 1  Once     11/26/17 1801   11/26/17 1802  DME Walker rolling  Once    Question:  Patient needs a walker to treat with the following condition  Answer:  Status post total replacement of right hip   11/26/17 1801      Diagnostic Studies: Dg Pelvis Portable  Result Date: 11/26/2017 CLINICAL DATA:  Status post right hip replacement today. EXAM: PORTABLE PELVIS 1-2 VIEWS COMPARISON:  Intraoperative imaging today. FINDINGS: Right total hip arthroplasty is in place. The device is located. No fracture. There is some gas in the soft tissues from surgery. IMPRESSION: Status post right total hip replacement.  No acute abnormality. Electronically Signed   By:  Inge Rise M.D.   On: 11/26/2017 14:47   Dg C-arm 1-60 Min  Result Date: 11/26/2017 CLINICAL DATA:  Total hip arthroplasty. EXAM: DG C-ARM 61-120 MIN; OPERATIVE RIGHT HIP WITH PELVIS COMPARISON:  No recent prior. FINDINGS: Total right hip replacement.  Anatomic alignment.  Hardware intact. IMPRESSION: Total right hip replacement with anatomic alignment. Electronically Signed   By: Marcello Moores  Register   On: 11/26/2017 13:19   Dg Hip Operative Unilat With Pelvis Right  Result Date: 11/26/2017 CLINICAL DATA:  Total hip arthroplasty. EXAM: DG C-ARM 61-120 MIN; OPERATIVE RIGHT HIP WITH PELVIS COMPARISON:  No recent prior. FINDINGS: Total right hip replacement.  Anatomic alignment.  Hardware intact. IMPRESSION: Total right hip replacement with anatomic alignment. Electronically Signed   By: Marcello Moores  Register   On: 11/26/2017 13:19    Disposition: Discharge disposition: 01-Home or [place]-Home Follow up.   Specialty:  [place] Why:  A representative from [facility] will contact you to arrange start date and time for your therapy. Contact information: [place] [address] [phone]        Mcarthur Rossetti, MD Follow up in 2 week(s).   Specialty:  Orthopedic Surgery Contact information: [place] [address] [phone]            Signed: Mcarthur Rossetti 11/29/2017, 6:44 AM

## 2017-11-29 NOTE — Progress Notes (Signed)
Physical Therapy Treatment Patient Details Name: Kelly Ayers MRN: [id number] DOB: February 04, 1946 Today's Date: 11/29/2017    History of Present Illness Pt is a 72 y/o female s/p R THA, direct anterior approach. PMH including but not limited to cancer, HTN and pre-diabetes.    PT Comments    Pt continuing to make steady progress with functional mobility. Plan is for pt to d/c home today. Pt is ready for d/c from a PT perspective.   Pt would continue to benefit from skilled physical therapy services at this time while admitted and after d/c to address the below listed limitations in order to improve overall safety and independence with functional mobility.    Follow Up Recommendations  Home health PT;Supervision/Assistance - 24 hour     Equipment Recommendations  None recommended by PT    Recommendations for Other Services       Precautions / Restrictions Precautions Precautions: Fall Restrictions Weight Bearing Restrictions: Yes RLE Weight Bearing: Weight bearing as tolerated    Mobility  Bed Mobility Overal bed mobility: Needs Assistance Bed Mobility: Sit to Supine       Sit to supine: Min guard   General bed mobility comments: min guard for safety, increased time  Transfers Overall transfer level: Needs assistance Equipment used: Rolling walker (2 wheeled) Transfers: Sit to/from Stand Sit to Stand: Supervision         General transfer comment: increased time and effort, good technique, supervision for safety  Ambulation/Gait Ambulation/Gait assistance: Supervision Ambulation Distance (Feet): 200 Feet Assistive device: Rolling walker (2 wheeled) Gait Pattern/deviations: Step-through pattern;Decreased stride length Gait velocity: decreased Gait velocity interpretation: 1.31 - 2.62 ft/sec, indicative of limited community ambulator General Gait Details: pt steady with RW, limited secondary to fatigue   Stairs             Wheelchair Mobility     Modified Rankin (Stroke Patients Only)       Balance Overall balance assessment: Needs assistance Sitting-balance support: Feet supported Sitting balance-Leahy Scale: Good     Standing balance support: No upper extremity supported;During functional activity;Bilateral upper extremity supported Standing balance-Leahy Scale: Fair                              Cognition Arousal/Alertness: Awake/alert Behavior During Therapy: WFL for tasks assessed/performed Overall Cognitive Status: Within Functional Limits for tasks assessed                                        Exercises Total Joint Exercises Knee Flexion: Standing;AROM;Strengthening;Right;10 reps Marching in Standing: AROM;Strengthening;Right;10 reps;Standing    General Comments        Pertinent Vitals/Pain Pain Assessment: Faces Faces Pain Scale: Hurts a little bit Pain Location: R hip Pain Descriptors / Indicators: Sore Pain Intervention(s): Monitored during session;Repositioned    Home Living                      Prior Function            PT Goals (current goals can now be found in the care plan section) Acute Rehab PT Goals PT Goal Formulation: With patient Time For Goal Achievement: 12/11/17 Potential to Achieve Goals: Good Progress towards PT goals: Progressing toward goals    Frequency    7X/week      PT Plan Current plan remains appropriate  Co-evaluation              AM-PAC PT "6 Clicks" Daily Activity  Outcome Measure  Difficulty turning over in bed (including adjusting bedclothes, sheets and blankets)?: None Difficulty moving from lying on back to sitting on the side of the bed? : None Difficulty sitting down on and standing up from a chair with arms (e.g., wheelchair, bedside commode, etc,.)?: Unable Help needed moving to and from a bed to chair (including a wheelchair)?: None Help needed walking in hospital room?: A Little Help needed  climbing 3-5 steps with a railing? : A Little 6 Click Score: 19    End of Session   Activity Tolerance: Patient limited by fatigue Patient left: in bed;with call bell/phone within reach Nurse Communication: Mobility status PT Visit Diagnosis: Other abnormalities of gait and mobility (R26.89);Pain Pain - Right/Left: Right Pain - part of body: Hip     Time: 0911-0924 PT Time Calculation (min) (ACUTE ONLY): 13 min  Charges:  $Gait Training: 8-22 mins                    G Codes:       [place], [place], [place] 11/29/2017, 12:54 PM

## 2017-12-02 ENCOUNTER — Telehealth (INDEPENDENT_AMBULATORY_CARE_PROVIDER_SITE_OTHER): Payer: Self-pay | Admitting: Orthopaedic Surgery

## 2017-12-02 NOTE — Telephone Encounter (Signed)
Husband called on behalf of wife who recently had hip surgery, he has a few questions for you if you could give him a call back at [phone]

## 2017-12-02 NOTE — Telephone Encounter (Signed)
Patient asking about ice, and constipation  Told her to use ice as needed and Dulcolax

## 2017-12-02 NOTE — Telephone Encounter (Signed)
Verbal order left on VM  

## 2017-12-02 NOTE — Telephone Encounter (Signed)
Kelly Ayers -(PT) with [facility] called needing verbal orders for HHPT 1 wk 1, 2 wk 2 and 1 wk 1. The number to contact Kelly Ayers is [phone]

## 2017-12-10 ENCOUNTER — Ambulatory Visit (INDEPENDENT_AMBULATORY_CARE_PROVIDER_SITE_OTHER): Payer: Medicare HMO | Admitting: Physician Assistant

## 2017-12-10 ENCOUNTER — Encounter (INDEPENDENT_AMBULATORY_CARE_PROVIDER_SITE_OTHER): Payer: Self-pay | Admitting: Physician Assistant

## 2017-12-10 DIAGNOSIS — Z96641 Presence of right artificial hip joint: Secondary | ICD-10-CM

## 2017-12-10 NOTE — Progress Notes (Signed)
HPI: Ms. Kelly Ayers returns 2-week status post right total hip arthroplasty.  She is overall doing well.  She had no shortness of breath, chills, nausea or vomiting.  She is overall progressing well with physical therapy.  She is no longer taking the oxycodone.  She is on aspirin 81 mg twice daily.  Physical exam: General well-developed well-nourished female no acute distress Right lower extremity: Calf supple nontender.  Good range of motion of the right hip without pain.  Surgical incisions healing well no signs of dehiscence erythema or drainage.  Impression: 2 weeks status post right total hip arthroplasty  Plan: She will take 81 mg once daily for the next week.  New Steri-Strips are applied today she will let these fall off with time.  She is able to shower and get the incision wet.  Scar tissue mobilization is encouraged.  She will follow-up with [place] in a month sooner if there is any questions or concerns.

## 2017-12-25 ENCOUNTER — Telehealth (INDEPENDENT_AMBULATORY_CARE_PROVIDER_SITE_OTHER): Payer: Self-pay | Admitting: Orthopaedic Surgery

## 2017-12-25 NOTE — Telephone Encounter (Signed)
Those types of sensations can be normal after hip replacements.

## 2017-12-25 NOTE — Telephone Encounter (Signed)
Ok with no pain correct?

## 2017-12-25 NOTE — Telephone Encounter (Signed)
Patient aware of the below message  

## 2017-12-25 NOTE — Telephone Encounter (Signed)
Patient left a message stating that she has a question in regards to her hip replacement surgery.  CB#[phone].  Thank you.

## 2018-01-09 ENCOUNTER — Encounter (INDEPENDENT_AMBULATORY_CARE_PROVIDER_SITE_OTHER): Payer: Self-pay | Admitting: Physician Assistant

## 2018-01-09 ENCOUNTER — Ambulatory Visit (INDEPENDENT_AMBULATORY_CARE_PROVIDER_SITE_OTHER): Payer: Medicare HMO | Admitting: Physician Assistant

## 2018-01-09 DIAGNOSIS — Z96641 Presence of right artificial hip joint: Secondary | ICD-10-CM

## 2018-01-09 DIAGNOSIS — M1712 Unilateral primary osteoarthritis, left knee: Secondary | ICD-10-CM | POA: Diagnosis not present

## 2018-01-09 MED ORDER — METHYLPREDNISOLONE ACETATE 40 MG/ML IJ SUSP
40.0000 mg | INTRAMUSCULAR | Status: AC | PRN
  Administered 2018-01-09: 40 mg via INTRA_ARTICULAR

## 2018-01-09 MED ORDER — LIDOCAINE HCL 1 % IJ SOLN
3.0000 mL | INTRAMUSCULAR | Status: AC | PRN
  Administered 2018-01-09: 3 mL

## 2018-01-09 NOTE — Progress Notes (Signed)
Office Visit Note   Patient: Kelly Ayers           Date of Birth: 16-Aug-1945           MRN: [id number] Visit Date: 01/09/2018              Requested by: Loraine Leriche., MD [address] [place], [place] [address] PCP: Loraine Leriche., MD   Assessment & Plan: Visit Diagnoses:  1. Status post total replacement of right hip   2. Primary osteoarthritis of left knee     Plan: We will see her back for the knee on an as-needed basis.  She understands that she can have cortisone injections as often as every 3 months.  Due to the severity of her knee arthritis I do not feel that a supplemental injection would be beneficial to her though.  In regards to the right hip she will follow-up with [place] in 1 year at that time we will obtain an AP pelvis and lateral view of her right hip.  Questions were encouraged and answered at length.  Follow-Up Instructions: No follow-ups on file.   Orders:  Orders Placed This Encounter  Procedures  . Large Joint Inj   No orders of the defined types were placed in this encounter.     Procedures: Large Joint Inj: L knee on 01/09/2018 4:30 PM Indications: pain Details: 22 G 1.5 in needle, anterolateral approach  Arthrogram: No  Medications: 3 mL lidocaine 1 %; 40 mg methylPREDNISolone acetate 40 MG/ML Outcome: tolerated well, no immediate complications Procedure, treatment alternatives, risks and benefits explained, specific risks discussed. Consent was given by the patient. Immediately prior to procedure a time out was called to verify the correct patient, procedure, equipment, support staff and site/side marked as required. Patient was prepped and draped in the usual sterile fashion.       Clinical Data: No additional findings.   Subjective: Chief Complaint  Patient presents with  . Right Hip - Routine Post Op    HPI  Kelly Ayers returns today follow-up of her right total hip arthroplasty which was performed 11/26/2017.  She  states overall she is improving.  In complaint today is her left knee.  She has known severe osteoarthritis of the knee.  She having trouble getting up and down stairs due to mainly to the left knee.  She had no fevers chills no wound healing problems.  She does note a clicking sensation in the hip at times but this is nonpainful.  Review of Systems Please see HPI otherwise negative  Objective: Vital Signs: There were no vitals taken for this visit.  Physical Exam  Constitutional: She is oriented to person, place, and time. She appears well-developed and well-nourished. No distress.  Pulmonary/Chest: Effort normal.  Neurological: She is alert and oriented to person, place, and time.  Skin: She is not diaphoretic.  Psychiatric: She has a normal mood and affect.    Ortho Exam Right hip good range of motion without pain.  Right calf supple nontender.  Dorsiflexion plantarflexion ankle intact. Left knee tenderness along medial joint line.  Slight effusion.  No abnormal warmth or erythema.  Crepitus with passive range of motion. Specialty Comments:  No specialty comments available.  Imaging: No results found.   PMFS History: Patient Active Problem List   Diagnosis Date Noted  . Osteoarthritis of right hip 11/26/2017  . Status post total replacement of right hip 11/26/2017   Past Medical History:  Diagnosis Date  .  Anemia    after hysterectomy; had to have blood transfusion; no current issues  . Cancer ([place])   . High cholesterol   . Hypertension   . Osteoarthritis of hip   . Pre-diabetes     History reviewed. No pertinent family history.  Past Surgical History:  Procedure Laterality Date  . ABDOMINAL HYSTERECTOMY  2010  . BLEPHAROPLASTY Bilateral   . CHOLECYSTECTOMY    . CLOSED MANIPULATION SHOULDER Right   . TOTAL HIP ARTHROPLASTY Right 11/26/2017   Procedure: RIGHT TOTAL HIP ARTHROPLASTY ANTERIOR APPROACH;  Surgeon: Mcarthur Rossetti, MD;  Location: [facility];  Service:  Orthopedics;  Laterality: Right;  . TUBAL LIGATION    . VAGINAL DELIVERY     x 3  . WISDOM TOOTH EXTRACTION     Social History   Occupational History  . Not on file  Tobacco Use  . Smoking status: Never Smoker  . Smokeless tobacco: Never Used  Substance and Sexual Activity  . Alcohol use: Yes    Comment: occasionally  . Drug use: Never  . Sexual activity: Not on file

## 2018-11-10 ENCOUNTER — Encounter: Payer: Self-pay | Admitting: Orthopaedic Surgery

## 2018-11-10 ENCOUNTER — Other Ambulatory Visit: Payer: Self-pay

## 2018-11-10 ENCOUNTER — Ambulatory Visit (INDEPENDENT_AMBULATORY_CARE_PROVIDER_SITE_OTHER): Payer: Medicare HMO

## 2018-11-10 ENCOUNTER — Ambulatory Visit: Payer: Medicare HMO | Admitting: Orthopaedic Surgery

## 2018-11-10 DIAGNOSIS — Z96641 Presence of right artificial hip joint: Secondary | ICD-10-CM | POA: Diagnosis not present

## 2018-11-10 DIAGNOSIS — M1712 Unilateral primary osteoarthritis, left knee: Secondary | ICD-10-CM

## 2018-11-10 NOTE — Progress Notes (Signed)
Office Visit Note   Patient: Kelly Ayers           Date of Birth: 1946-01-11           MRN: [id number] Visit Date: 11/10/2018              Requested by: Loraine Leriche., MD [address] [place], [place] [address] PCP: Loraine Leriche., MD   Assessment & Plan: Visit Diagnoses:  1. History of right hip replacement   2. Unilateral primary osteoarthritis, left knee     Plan: As far as her right hip goes, she will follow-up as needed.  We talked about hip strengthening exercises and think she should look out for if she is developing any issues with the right hip.  We can always see her for repeat x-rays if she develops any type of dull aching pain.  As far as her left knee, we will continue to follow this conservatively until it bothers her enough to perform knee replacement surgery.  She will continue work on quad training exercises and activity modification.  All question concerns were answered and addressed.  Follow-up is as needed.  Follow-Up Instructions: Return if symptoms worsen or fail to improve.   Orders:  Orders Placed This Encounter  Procedures  . XR HIP UNILAT W OR W/O PELVIS 1V RIGHT   No orders of the defined types were placed in this encounter.     Procedures: No procedures performed   Clinical Data: No additional findings.   Subjective: Chief Complaint  Patient presents with  . Right Hip - Follow-up  The patient is 1 year out from a right total hip arthroplasty direct anterior approach.  She says she has good motion and strength is doing well overall.  She actually has a known history of significant osteoarthritis of her left knee which still bothers her.  An injection that we performed in July of last year and that left knee with a steroid did not help much.  She still ambulates without an assistive device.  She is very young appearing 22.  We talked about different exercises that she can try to help increase her quad strength as well as her hip  strength.  She denies any active changes in her medical status.  HPI  Review of Systems She currently denies any headache, chest pain, shortness of breath, fever, chills, nausea, vomiting  Objective: Vital Signs: There were no vitals taken for this visit.  Physical Exam She is alert and orient x3 and in no acute distress Ortho Exam Examination of her right operative hip shows the range of motion to be entirely full.  She has excellent strength of the right hip.  Examination of her left knee shows medial joint line tenderness with varus malalignment and good range of motion overall.  The left knee is ligamentously stable. Specialty Comments:  No specialty comments available.  Imaging: Xr Hip Unilat W Or W/o Pelvis 1v Right  Result Date: 11/10/2018 An AP pelvis and lateral the right hip shows a well-seated total hip arthroplasty on the right side without any evidence of loosening or other complicating features.    PMFS History: Patient Active Problem List   Diagnosis Date Noted  . Unilateral primary osteoarthritis, left knee 11/10/2018  . History of right hip replacement 11/10/2018  . Osteoarthritis of right hip 11/26/2017  . Status post total replacement of right hip 11/26/2017   Past Medical History:  Diagnosis Date  . Anemia  after hysterectomy; had to have blood transfusion; no current issues  . Cancer ([place])   . High cholesterol   . Hypertension   . Osteoarthritis of hip   . Pre-diabetes     History reviewed. No pertinent family history.  Past Surgical History:  Procedure Laterality Date  . ABDOMINAL HYSTERECTOMY  2010  . BLEPHAROPLASTY Bilateral   . CHOLECYSTECTOMY    . CLOSED MANIPULATION SHOULDER Right   . TOTAL HIP ARTHROPLASTY Right 11/26/2017   Procedure: RIGHT TOTAL HIP ARTHROPLASTY ANTERIOR APPROACH;  Surgeon: Mcarthur Rossetti, MD;  Location: [facility];  Service: Orthopedics;  Laterality: Right;  . TUBAL LIGATION    . VAGINAL DELIVERY     x 3  .  WISDOM TOOTH EXTRACTION     Social History   Occupational History  . Not on file  Tobacco Use  . Smoking status: Never Smoker  . Smokeless tobacco: Never Used  Substance and Sexual Activity  . Alcohol use: Yes    Comment: occasionally  . Drug use: Never  . Sexual activity: Not on file

## 2019-04-08 IMAGING — DX DG PORTABLE PELVIS
1 series · 1 of 1 positions shown · non-contrast
Comparison: Intraoperative imaging today.

CLINICAL DATA: Status post right hip replacement today.

EXAM:
PORTABLE PELVIS 1-2 VIEWS

[pelvis ap]
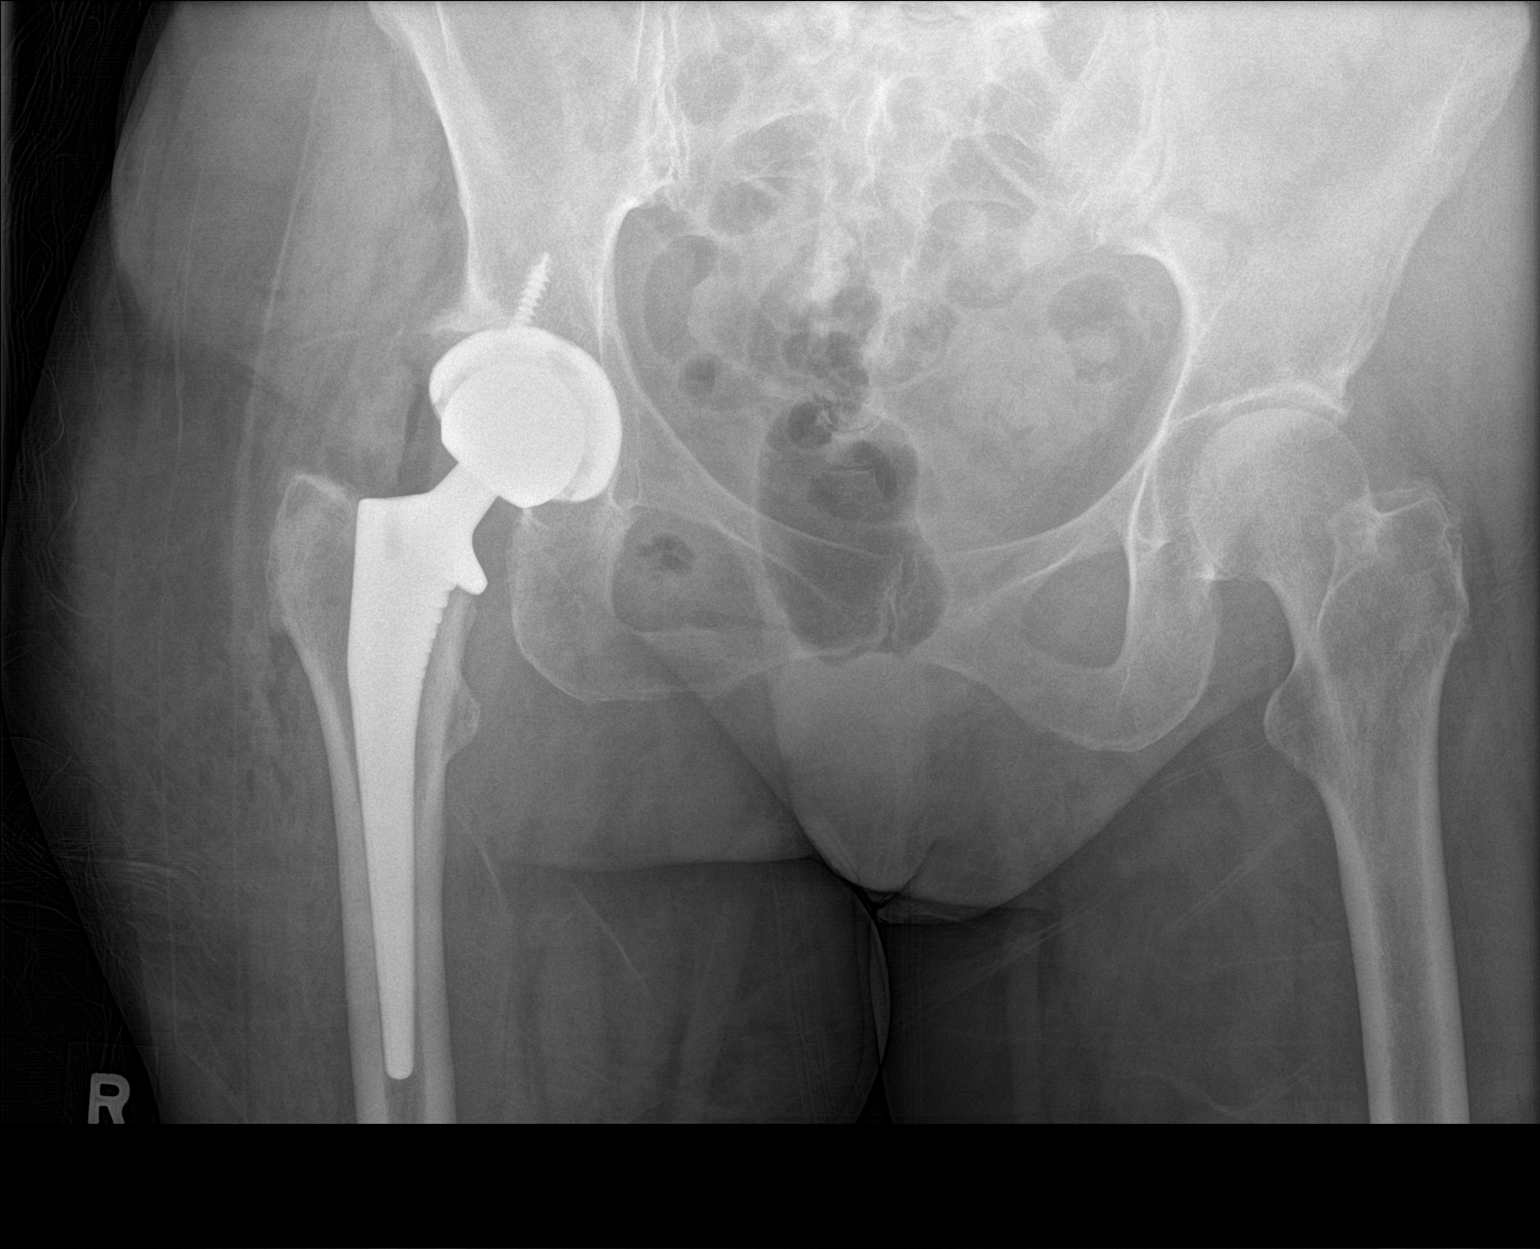

[1 of 1 positions shown; findings below may reference images not displayed]

FINDINGS: Right total hip arthroplasty is in place. The device is located. No
fracture. There is some gas in the soft tissues from surgery.
IMPRESSION: Status post right total hip replacement.  No acute abnormality.

## 2019-08-27 ENCOUNTER — Other Ambulatory Visit: Payer: Self-pay

## 2019-08-27 ENCOUNTER — Ambulatory Visit (INDEPENDENT_AMBULATORY_CARE_PROVIDER_SITE_OTHER): Payer: Medicare HMO

## 2019-08-27 ENCOUNTER — Ambulatory Visit: Payer: Medicare HMO | Admitting: Orthopaedic Surgery

## 2019-08-27 DIAGNOSIS — M1712 Unilateral primary osteoarthritis, left knee: Secondary | ICD-10-CM

## 2019-08-27 DIAGNOSIS — M25562 Pain in left knee: Secondary | ICD-10-CM

## 2019-08-27 DIAGNOSIS — G8929 Other chronic pain: Secondary | ICD-10-CM

## 2019-08-27 MED ORDER — LIDOCAINE HCL 1 % IJ SOLN
3.0000 mL | INTRAMUSCULAR | Status: AC | PRN
  Administered 2019-08-27: 15:00:00 3 mL

## 2019-08-27 MED ORDER — METHYLPREDNISOLONE ACETATE 40 MG/ML IJ SUSP
40.0000 mg | INTRAMUSCULAR | Status: AC | PRN
  Administered 2019-08-27: 40 mg via INTRA_ARTICULAR

## 2019-08-27 NOTE — Progress Notes (Signed)
Office Visit Note   Patient: Kelly Ayers           Date of Birth: 12-13-1945           MRN: [id number] Visit Date: 08/27/2019              Requested by: Loraine Leriche., MD [address],  [place] [address] PCP: Loraine Leriche., MD   Assessment & Plan: Visit Diagnoses:  1. Chronic pain of left knee   2. Unilateral primary osteoarthritis, left knee     Plan: I did recommend a steroid injection in her knee today to calm down her acute pain and she agrees with this.  She will work on quad strengthening exercises and minimize her trips up and down hills.  I have recommended total knee arthroplasty.  Since she is now moved to [place] it would probably be in her best interest to have this done closer to home then coming back here for this.  I am happy to do this for her but I do feel that she would benefit more from having this done elsewhere.  All question concerns were answered addressed.  She did tolerate steroid injection well in her left knee today.  Follow-Up Instructions: Return if symptoms worsen or fail to improve.   Orders:  Orders Placed This Encounter  Procedures  . Large Joint Inj  . XR Knee 1-2 Views Left   No orders of the defined types were placed in this encounter.     Procedures: Large Joint Inj: L knee on 08/27/2019 2:37 PM Indications: diagnostic evaluation and pain Details: 22 G 1.5 in needle, superolateral approach  Arthrogram: No  Medications: 3 mL lidocaine 1 %; 40 mg methylPREDNISolone acetate 40 MG/ML Outcome: tolerated well, no immediate complications Procedure, treatment alternatives, risks and benefits explained, specific risks discussed. Consent was given by the patient. Immediately prior to procedure a time out was called to verify the correct patient, procedure, equipment, support staff and site/side marked as required. Patient was prepped and draped in the usual sterile fashion.       Clinical Data: No additional  findings.   Subjective: Chief Complaint  Patient presents with  . Left Knee - Pain  The patient is a very pleasant 74 year old female that have actually replaced her right hip before.  She is now moved to near [facility].  She is seeing her daughter in this area and came in today with worsening left knee pain.  She said that he has been very sore and she points the medial compartment of her knee.  Is been hurting quite a bit going up and down stairs and there is a lot of popping with her knee.  This is been slowly getting worse with time but has gotten bad enough during their move.  She is not a diabetic and not a smoker.  HPI  Review of Systems She currently denies any headache, chest pain, shortness of breath, fever, chills, nausea, vomiting  Objective: Vital Signs: There were no vitals taken for this visit.  Physical Exam She is alert and orient x3 and in no acute distress Ortho Exam Examination of her left knee shows she lacks full extension by about 3 degrees to 5 degrees.  Is very painful.  She has patellofemoral crepitation and significant medial joint line tenderness.  Range of motion is near full.  The knee has slight varus malalignment that is correctable.  The knee feels ligamentously stable. Specialty Comments:  No specialty comments available.  Imaging: XR Knee 1-2 Views Left  Result Date: 08/27/2019 2 views of the left knee show severe tricompartment arthritic changes.  There is varus malalignment.  There is loss of the medial joint space and severe patellofemoral disease.  There are large periarticular osteophytes in the knee as well.    PMFS History: Patient Active Problem List   Diagnosis Date Noted  . Unilateral primary osteoarthritis, left knee 11/10/2018  . History of right hip replacement 11/10/2018  . Osteoarthritis of right hip 11/26/2017  . Status post total replacement of right hip 11/26/2017   Past Medical History:  Diagnosis Date  .  Anemia    after hysterectomy; had to have blood transfusion; no current issues  . Cancer ([place])   . High cholesterol   . Hypertension   . Osteoarthritis of hip   . Pre-diabetes     No family history on file.  Past Surgical History:  Procedure Laterality Date  . ABDOMINAL HYSTERECTOMY  2010  . BLEPHAROPLASTY Bilateral   . CHOLECYSTECTOMY    . CLOSED MANIPULATION SHOULDER Right   . TOTAL HIP ARTHROPLASTY Right 11/26/2017   Procedure: RIGHT TOTAL HIP ARTHROPLASTY ANTERIOR APPROACH;  Surgeon: Mcarthur Rossetti, MD;  Location: [facility];  Service: Orthopedics;  Laterality: Right;  . TUBAL LIGATION    . VAGINAL DELIVERY     x 3  . WISDOM TOOTH EXTRACTION     Social History   Occupational History  . Not on file  Tobacco Use  . Smoking status: Never Smoker  . Smokeless tobacco: Never Used  Substance and Sexual Activity  . Alcohol use: Yes    Comment: occasionally  . Drug use: Never  . Sexual activity: Not on file
